# Patient Record
Sex: Female | Born: 1955 | Race: White | Hispanic: No | Marital: Married | Smoking: Never smoker
Health system: Southern US, Community
[De-identification: ages and names within clinical notes are randomized; demographics above are authoritative.]

## PROBLEM LIST (undated history)

## (undated) DIAGNOSIS — N200 Calculus of kidney: Secondary | ICD-10-CM

## (undated) HISTORY — PX: KIDNEY STONE SURGERY: SHX686

## (undated) HISTORY — PX: CATARACT EXTRACTION: SUR2

## (undated) HISTORY — PX: FOOT SURGERY: SHX648

## (undated) HISTORY — PX: LASIK: SHX215

## (undated) HISTORY — PX: PALATE / UVULA BIOPSY / EXCISION: SUR128

---

## 1998-04-08 ENCOUNTER — Other Ambulatory Visit: Admission: RE | Admit: 1998-04-08 | Discharge: 1998-04-08 | Payer: Self-pay | Admitting: Gynecology

## 1999-04-12 ENCOUNTER — Other Ambulatory Visit: Admission: RE | Admit: 1999-04-12 | Discharge: 1999-04-12 | Payer: Self-pay | Admitting: Gynecology

## 1999-10-14 ENCOUNTER — Other Ambulatory Visit: Admission: RE | Admit: 1999-10-14 | Discharge: 1999-10-14 | Payer: Self-pay | Admitting: Gynecology

## 1999-10-14 ENCOUNTER — Encounter (INDEPENDENT_AMBULATORY_CARE_PROVIDER_SITE_OTHER): Payer: Self-pay

## 2000-04-13 ENCOUNTER — Other Ambulatory Visit: Admission: RE | Admit: 2000-04-13 | Discharge: 2000-04-13 | Payer: Self-pay | Admitting: Gynecology

## 2001-02-15 ENCOUNTER — Encounter: Payer: Self-pay | Admitting: Obstetrics and Gynecology

## 2001-02-15 ENCOUNTER — Ambulatory Visit (HOSPITAL_COMMUNITY): Admission: RE | Admit: 2001-02-15 | Discharge: 2001-02-15 | Payer: Self-pay | Admitting: Obstetrics and Gynecology

## 2001-04-26 ENCOUNTER — Encounter (INDEPENDENT_AMBULATORY_CARE_PROVIDER_SITE_OTHER): Payer: Self-pay | Admitting: Specialist

## 2001-04-26 ENCOUNTER — Observation Stay (HOSPITAL_COMMUNITY): Admission: RE | Admit: 2001-04-26 | Discharge: 2001-04-27 | Payer: Self-pay | Admitting: Obstetrics and Gynecology

## 2005-06-01 ENCOUNTER — Ambulatory Visit: Payer: Self-pay | Admitting: Internal Medicine

## 2005-06-09 ENCOUNTER — Ambulatory Visit: Payer: Self-pay | Admitting: Internal Medicine

## 2006-05-26 ENCOUNTER — Encounter: Admission: RE | Admit: 2006-05-26 | Discharge: 2006-05-26 | Payer: Self-pay | Admitting: Family Medicine

## 2006-08-21 ENCOUNTER — Emergency Department (HOSPITAL_COMMUNITY): Admission: EM | Admit: 2006-08-21 | Discharge: 2006-08-21 | Payer: Self-pay | Admitting: Emergency Medicine

## 2008-07-08 ENCOUNTER — Ambulatory Visit: Payer: Self-pay | Admitting: Orthopedic Surgery

## 2008-07-08 DIAGNOSIS — M722 Plantar fascial fibromatosis: Secondary | ICD-10-CM | POA: Insufficient documentation

## 2008-07-09 ENCOUNTER — Encounter: Payer: Self-pay | Admitting: Orthopedic Surgery

## 2008-08-04 ENCOUNTER — Ambulatory Visit: Payer: Self-pay | Admitting: Orthopedic Surgery

## 2008-08-05 ENCOUNTER — Encounter (INDEPENDENT_AMBULATORY_CARE_PROVIDER_SITE_OTHER): Payer: Self-pay | Admitting: *Deleted

## 2008-08-21 ENCOUNTER — Encounter: Payer: Self-pay | Admitting: Orthopedic Surgery

## 2010-02-04 ENCOUNTER — Encounter: Admission: RE | Admit: 2010-02-04 | Discharge: 2010-02-04 | Payer: Self-pay | Admitting: Gastroenterology

## 2010-08-13 NOTE — Op Note (Signed)
[facility]  Patient:    Rebecca Fernandez, Rebecca Fernandez Visit Number: [id number] MRN: [id number]          Service Type: SUR Location: [id number] Attending Physician:  Oliver Pila Dictated by:   Alvino Chapel, M.D. Proc. Date: 04/26/01 Admit Date:  04/26/2001                             Operative Report  PREOPERATIVE DIAGNOSES:  1. Menorrhagia.  2. Abnormal uterine bleeding.  3. Familial history of blood clots.  POSTOPERATIVE DIAGNOSES:  1. Menorrhagia.  2. Abnormal uterine bleeding.  3. Familial history of blood clots.  PROCEDURE:  Total vaginal hysterectomy.  SURGEON:  Alvino Chapel, M.D.  ASSISTANT:  Malachi Pro. Ambrose Mantle, M.D.  ANESTHESIA:  General.  FINDINGS:  The uterus was normal size, adnexa were visually normal.  FLUIDS:  Estimated blood loss 250 cc, urine output 125 cc clear.  IV FLUIDS:  2800 cc LR.  SPECIMENS:  To pathology the uterus and cervix were sent in entirety.  DESCRIPTION OF PROCEDURE:  The patient was taken to the operating room where general anesthesia was obtained without difficulty. She was then prepped and draped in a normal sterile fashion in the dorsal lithotomy position. After the Foley catheter was placed, a weighted speculum was placed within the vagina and the cervix identified and grasped with Bartholome Bill on the anterior and posterior lip. The cervix was then injected circumferentially with a dilute solution of Pitressin with good blanching noted under the vaginal mucosa. The Bovie cautery unit was then utilized to make a circumferential incision around the cervix in a clockwise fashion and dissected the vaginal mucosa from the underlying cervix itself. The Mayo scissors were then further utilized to continue this dissection until the posterior cul-de-sac was quite close and this was entered with the Mayo scissors. With the posterior cul-de-sac identified, the Bonnano speculum was placed  within it thus isolating the uterus from the rectum. The slightly curved zeppelins clamp was then used to bilaterally clamp the uterosacral ligaments. Each uterosacral ligament was transected and secured with a #0 Vicryl suture ligature. Attention was then turned to the anterior of the uterus which was sharply and bluntly dissected under the vaginal mucosa. The anterior cul-de-sac was entered sharply and intraperitoneal placement of the Sims retractor then performed. Thus with the uterus isolated from the bladder and the rectum, the slightly curved zeppelins clamps were used to sequentially clamp each of the remaining broad ligaments. At each step, this was transected and suture ligated with #0 Vicryl. This was carried up to the level of the utero-ovarian ligament at which point the uterus was grasped with a towel clip at its fundus and was delivered from the vagina with the utero-ovarian ligaments then visible. Each was clamped with a slightly curved zepellin clamp and the uterus completely removed from each pedicle and handed to pathology. Each utero-ovarian ligament was then secured with both free tie and a suture ligature of #0 Vicryl with some area of bleeding noted at the left utero-ovarian ligament. This was then secured with several figure-of-eight sutures of #0 Vicryl with good hemostasis noted. The right utero-ovarian ligament was also carefully inspected and two additional figure-of-eight sutures of #0 Vicryl placed on that pedicle for good hemostasis. At this point, it was noted that the posterior vaginal cuff had some significant bleeding, therefore, the cuff itself was run in a running locked suture with  hemostasis obtained there also. A sponge stick was then placed within the vaginal cuff and no further active bleeding was noted, therefore, the peritoneum was purse stringed with #0 Vicryl and closed. The vagina cuff itself was then closed with both a running suture of #0  Vicryl and several interrupted sutures of the same with excellent hemostasis noted. A postoperative rectal was performed and was normal and the urine remained clear throughout the procedure. Sponge, lap and needle counts were correct and the patient was awakened from general anesthesia and taken to the recovery room in stable condition. Dictated by:   Alvino Chapel, M.D. Attending Physician:  Oliver Pila DD:  04/26/01 TD:  04/27/01 Job: [id number]

## 2010-08-13 NOTE — H&P (Signed)
[facility]  Patient:    Rebecca Fernandez, Rebecca Fernandez Visit Number: [id number] MRN: [id number]          Service Type: Attending:  Alvino Chapel, M.D. Dictated by:   Alvino Chapel, M.D. Adm. Date:  04/26/01                           History and Physical  BRIEF HISTORY: The patient is a 55 year old G2, P2 who presents for a scheduled vaginal hysterectomy given an ongoing problem with abnormal uterine bleeding.  The patient first noted the problem 2 years ago when her cycle shortened to approximately every 15 days and therefore she was bleeding at least half of each month.  The patient at that time did have an endometrial biopsy performed which was normal and had a trial of Provera given at which point she did not tolerate secondary to side effects.  Ultrasound revealed a normal sized uterus with some question of a intrauterine polyp however, on saline infusion this was not visible.  The patients past medical history is significant for kidney stones.  PAST SURGICAL HISTORY:  She did have a laparotomy with a stone retrieval.  ALLERGIES:  None.  MEDICATIONS: 1. Ambien 2. Iron supplements.  PAST GYN HISTORY:  No abnormal Pap smears.  Menstrual cycle is as previously stated q.15 days lasting 6 to 7 days.  PAST OBSTETRICAL HISTORY:  She has had normal spontaneous vaginal deliveries of 9 pound infants.  FAMILY HISTORY; There was some history of blood clots in her family which is another concern for hormone use.  There is no breast cancer, colon cancer or heart disease.  SOCIAL HISTORY: The patient is a nonsmoker.  She had a recent mammogram which was within normal limits and a cholesterol which was within normal limits.  A current FSH is premenopausal at 5.8.  PHYSICAL EXAMINATION:  GENERAL:  Height if 5 feet 2 inches.  Weight 140 pounds.  HEART:  Regular rate and rhythm.  LUNGS:  Clear to auscultation bilaterally.  ABDOMEN:  Soft and  nontender.  PELVIC:  The uterus has good descensus.  It is normal size.  Adnexa have no masses.  External genitalia is normal with no significant cystocele or rectocele noted.  The patient was counseled as to possible surgical approached for this problem given her intolerance of hormonal therapy and family history of blood clots. She was given the option for a trial of endometrial ablation with a ThermaChoice balloon versus having a vaginal hysterectomy.  The patients states that she wishes to proceed with definitive surgical therapy given the ongoing problem with bleeding and reactive anemia.  She was counseled as to the risks and benefits of the surgery including bleeding, infection, and possible damage to bowel and bladder.  She understands these risks and desires to proceed.  She was also counseled as to the possibility of requiring an incision to successfully perform the surgery should bleeding or any other eventuality occur which precludes removal of the uterus through the vagina and agrees to this as well. Dictated by:   Alvino Chapel, M.D. Attending:  Alvino Chapel, M.D. DD:  04/24/01 TD:  04/24/01 Job: [id number]

## 2010-09-12 ENCOUNTER — Emergency Department (HOSPITAL_COMMUNITY)
Admission: EM | Admit: 2010-09-12 | Discharge: 2010-09-12 | Disposition: A | Discharge status: Home / Self Care | Payer: 59 | Attending: Emergency Medicine | Admitting: Emergency Medicine

## 2010-09-12 ENCOUNTER — Emergency Department (HOSPITAL_COMMUNITY): Payer: 59

## 2010-09-12 DIAGNOSIS — N390 Urinary tract infection, site not specified: Secondary | ICD-10-CM | POA: Insufficient documentation

## 2010-09-12 DIAGNOSIS — Z79899 Other long term (current) drug therapy: Secondary | ICD-10-CM | POA: Insufficient documentation

## 2010-09-12 DIAGNOSIS — Z87442 Personal history of urinary calculi: Secondary | ICD-10-CM | POA: Insufficient documentation

## 2010-09-12 DIAGNOSIS — F411 Generalized anxiety disorder: Secondary | ICD-10-CM | POA: Insufficient documentation

## 2010-09-12 LAB — POCT I-STAT, CHEM 8
BUN: 4 mg/dL — ABNORMAL LOW (ref 6–23)
Creatinine, Ser: 0.9 mg/dL (ref 0.50–1.10)
Potassium: 3.7 mEq/L (ref 3.5–5.1)
Sodium: 136 mEq/L (ref 135–145)

## 2010-09-12 LAB — URINALYSIS, ROUTINE W REFLEX MICROSCOPIC
Glucose, UA: NEGATIVE mg/dL
Ketones, ur: NEGATIVE mg/dL
Protein, ur: NEGATIVE mg/dL
Urobilinogen, UA: 0.2 mg/dL (ref 0.0–1.0)

## 2010-09-14 LAB — URINE CULTURE
Colony Count: NO GROWTH
Culture  Setup Time: 201206181220
Culture: NO GROWTH

## 2010-11-12 ENCOUNTER — Ambulatory Visit (INDEPENDENT_AMBULATORY_CARE_PROVIDER_SITE_OTHER): Payer: 59 | Admitting: Urology

## 2010-11-12 DIAGNOSIS — R310 Gross hematuria: Secondary | ICD-10-CM

## 2011-01-07 ENCOUNTER — Ambulatory Visit: Payer: 59 | Admitting: Urology

## 2011-01-11 ENCOUNTER — Ambulatory Visit (INDEPENDENT_AMBULATORY_CARE_PROVIDER_SITE_OTHER): Payer: BC Managed Care – PPO | Admitting: Orthopedic Surgery

## 2011-01-11 ENCOUNTER — Encounter: Payer: Self-pay | Admitting: Orthopedic Surgery

## 2011-01-11 VITALS — BP 100/62 | Ht 61.75 in | Wt 135.0 lb

## 2011-01-11 DIAGNOSIS — M7710 Lateral epicondylitis, unspecified elbow: Secondary | ICD-10-CM

## 2011-01-11 NOTE — Patient Instructions (Addendum)
You have tennis elbow   You have received a steroid shot. 15% of patients experience increased pain at the injection site with in the next 24 hours. This is best treated with ice and tylenol extra strength 2 tabs every 8 hours. If you are still having pain please call the office.   Start exercises Thursday as instructed   Wear brace x 6 weeks ( except at night )  Take advil or aleve x 6 weeks (advil 400mg  3 x a day or aleve twice a day )

## 2011-01-11 NOTE — Progress Notes (Signed)
Chief complaint knot on LEFT elbow.  2 week history of pain with elbow extension or picking up objects with the LEFT upper extremity. It is in the LEFT elbow. The symptoms came on gradually and are associated with sharp, dull, throbbing, 7/10 pain, which comes and goes, especially in the morning. Reported catching and swelling.  Review of systems negative. Findings. Denies chest pain, shortness of breath, numbness, tingling. Review of systems positive findings constipation, hematuria, anxiety.  History reviewed. No pertinent past medical history.   Physical exam: Elbow  Vital signs recorded and are stable. General appearance normal. Orientation x3 normal. Mood and affect. Normal. Ambulation, normal.  Left elbow:  ROM is normal With passive range of motion and limited by 10 loss of extension with active Strength is normal  Stability is normal  Inspection: Tenderness over the lateral epicondyle Skin normal   Radial and ulnar pulse is normal  Epitrochlea nodes none Sensation is normal  Reflexes at the elbow are normal   Radiographs showed normal ulnohumeral joint with a small olecranon spur.  Impression tennis elbow.  Plan inject elbow Exercise program Tennis elbow brace  Followup if not improved  Lateral epicondyle injection. (LEFT elbow)  Consent.  Timeout to confirm site.  LEFT elbow was injected with Depo-Medrol 40 mg and lidocaine 1% 3 cc with sterile technique using alcohol and ethyl chloride prep.  There were no complications   Separate x-ray report.  Pain, lateral elbow.  AP and lateral elbow, show that there is no evidence of arthritis, but there is a small olecranon spur.  Impression normal elbow with olecranon spur.

## 2014-01-05 ENCOUNTER — Emergency Department (HOSPITAL_COMMUNITY)
Admission: EM | Admit: 2014-01-05 | Discharge: 2014-01-05 | Disposition: A | Discharge status: Home / Self Care | Payer: 59 | Attending: Emergency Medicine | Admitting: Emergency Medicine

## 2014-01-05 ENCOUNTER — Encounter (HOSPITAL_COMMUNITY): Payer: Self-pay | Admitting: Emergency Medicine

## 2014-01-05 ENCOUNTER — Emergency Department (HOSPITAL_COMMUNITY): Payer: 59

## 2014-01-05 DIAGNOSIS — M79642 Pain in left hand: Secondary | ICD-10-CM

## 2014-01-05 DIAGNOSIS — S6992XA Unspecified injury of left wrist, hand and finger(s), initial encounter: Secondary | ICD-10-CM | POA: Diagnosis present

## 2014-01-05 DIAGNOSIS — Y92010 Kitchen of single-family (private) house as the place of occurrence of the external cause: Secondary | ICD-10-CM | POA: Insufficient documentation

## 2014-01-05 DIAGNOSIS — Z79899 Other long term (current) drug therapy: Secondary | ICD-10-CM | POA: Insufficient documentation

## 2014-01-05 DIAGNOSIS — X58XXXA Exposure to other specified factors, initial encounter: Secondary | ICD-10-CM | POA: Diagnosis not present

## 2014-01-05 DIAGNOSIS — Y9389 Activity, other specified: Secondary | ICD-10-CM | POA: Diagnosis not present

## 2014-01-05 MED ORDER — MELOXICAM 15 MG PO TABS: 15.0000 mg | ORAL_TABLET | Freq: Every day | ORAL | Status: DC

## 2014-01-05 MED ORDER — ONDANSETRON HCL 4 MG PO TABS
4.0000 mg | ORAL_TABLET | Freq: Once | ORAL | Status: AC
  Administered 2014-01-05: 4 mg via ORAL
  Filled 2014-01-05: qty 1

## 2014-01-05 MED ORDER — KETOROLAC TROMETHAMINE 10 MG PO TABS
10.0000 mg | ORAL_TABLET | Freq: Once | ORAL | Status: AC
Start: 2014-01-05 — End: 2014-01-05
  Administered 2014-01-05: 10 mg via ORAL
  Filled 2014-01-05: qty 1

## 2014-01-05 MED ORDER — HYDROCODONE-ACETAMINOPHEN 5-325 MG PO TABS
2.0000 | ORAL_TABLET | Freq: Once | ORAL | Status: AC
  Administered 2014-01-05: 2 via ORAL
  Filled 2014-01-05: qty 2

## 2014-01-05 MED ORDER — HYDROCODONE-ACETAMINOPHEN 5-325 MG PO TABS: 1.0000 | ORAL_TABLET | ORAL | Status: DC | PRN

## 2014-01-05 NOTE — ED Provider Notes (Signed)
CSN: [id number]     Arrival date & time 01/05/14  1656 History  This chart was scribed for Lily Kocher, PA-C, working with Veryl Speak, MD by Steva Colder, ED Scribe. The patient was seen in room APFT23/APFT23 at 7:06 PM.     Chief Complaint  Patient presents with  . Hand Injury    HPI Rebecca Fernandez is a 58 y.o. female who presents today complaining of left hand injury onset yesterday. She states that she was pushing an [place] in the kitchen. She states that it began to hurt. She states that the pain has been getting worse. She states that she can not bend the hand or lay it flat. She states that she didn't do more than what she normally does as far as involving her hands. She states that she picked up laminated floors. 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 She states that she tried Ice with no relief for her symptoms. She denies any other associated symptoms. She denies being on a blood thinner medication.  History reviewed. No pertinent past medical history. Past Surgical History  Procedure Laterality Date  . Foot surgery    . Kidney stone surgery    . Lasik     Family History  Problem Relation Age of Onset  . Heart disease    . Arthritis    . Lung disease    . Cancer     History  Substance Use Topics  . Smoking status: Never Smoker   . Smokeless tobacco: Not on file  . Alcohol Use: No     Comment: occasionally   OB History   Grav Para Term Preterm Abortions TAB SAB Ect Mult Living                 Review of Systems  Musculoskeletal: Positive for arthralgias (left hand).      Allergies  Review of patient's allergies indicates no known allergies.  Home Medications   Prior to Admission medications   Medication Sig Start Date End Date Taking? Authorizing Provider  buPROPion (ZYBAN) 150 MG 12 hr tablet Take 150 mg by mouth 2 (two) times daily.     Historical Provider, MD  clonazePAM (KLONOPIN) 0.5 MG tablet Take 0.5 mg by mouth 2  (two) times daily as needed.     Historical Provider, MD  estradiol (ESTRACE) 0.5 MG tablet Take 0.5 mg by mouth daily.     Historical Provider, MD  zolpidem (AMBIEN) 10 MG tablet Take 10 mg by mouth at bedtime as needed.     Historical Provider, MD   BP 117/69  Pulse 67  Temp(Src) 97.9 F (36.6 C) (Oral)  Resp 16  Ht 5\' 2"  (1.575 m)  Wt 135 lb (61.236 kg)  BMI 24.69 kg/m2  SpO2 100%  Physical Exam  Nursing note and vitals reviewed. Constitutional: She is oriented to person, place, and time. She appears well-developed and well-nourished. No distress.  HENT:  Head: Normocephalic and atraumatic.  Eyes: EOM are normal.  Neck: Neck supple. No tracheal deviation present.  Cardiovascular: Normal rate.   Pulses:      Radial pulses are 2+ on the left side.  Brachial pulses 2+  Pulmonary/Chest: Effort normal. No respiratory distress.  Musculoskeletal: Normal range of motion.  Full ROM of the left shoulder and elbow. Capillary refill less than 2 seconds. Mild to moderate swelling of the fingers to the left hand and left hand. Bruising to the  ulnar surface of the left hand just below MP joint of the fith finger, pain of the lesser thenar eminence extending into the wrist to palpation and movement.   Neurological: She is alert and oriented to person, place, and time.  Skin: Skin is warm and dry.  Psychiatric: She has a normal mood and affect. Her behavior is normal.    ED Course  Procedures (including critical care time) DIAGNOSTIC STUDIES: Oxygen Saturation is 100% on room air, normal by my interpretation.    COORDINATION OF CARE: 7:15 PM-Discussed treatment plan which includes Splint, referral to Orthopedist, pain medication with pt at bedside and pt agreed to plan.   Labs Review Labs Reviewed - No data to display  Imaging Review Dg Hand Complete Left  01/05/2014   CLINICAL DATA:  Hand injury while moving furniture. Left palmar hand pain. Initial encounter.  EXAM: LEFT HAND -  COMPLETE 3+ VIEW  COMPARISON:  None.  FINDINGS: There is no evidence of fracture or dislocation. There is no evidence of arthropathy or other focal bone abnormality. Soft tissues are unremarkable.  IMPRESSION: Negative.   Electronically Signed   By: Earle Gell M.D.   On: 01/05/2014 18:23     EKG Interpretation None      MDM Vital signs are within normal limits. Pulse oximetry is 100% on room air. Within normal limits by my interpretation.  Xray of the left hand is negative.The radial and brachial pulses are 2+. Capillary refill is less than 2 seconds. Patient will not allow testing of the pulmar  arch due to pain. The thenar imminence is pink. There is bruising to the lateral surface of the fifth metacarpal and there is pain at the radial surface of the wrists extending into the lower portion of the thenar eminence. Suspect that the patient has an overuse injury, and possibly A. occult bruise on given the bruised area of the side of her hand. The tips of her fingers are cool, slightly cooler on the left than on the right, but as noted above capillary refill is less than 2 seconds. There is no pain or tingling involving the fingers, the pain is at the lateral hand with bruising is. There is no tendon compromise.  The plan at this time is for the patient be fitted with a wrist splint, Norco, and Mobic. Patient is to return to the emergency department if any changes, problems, or concerns.    Final diagnoses:  None    **I have reviewed nursing notes, vital signs, and all appropriate lab and imaging results for this patient.*  I personally performed the services described in this documentation, which was scribed in my presence. The recorded information has been reviewed and is accurate.    Lenox Ahr, PA-C 01/05/14 1928

## 2014-01-05 NOTE — ED Notes (Addendum)
Pt reports left hand pain since helping husband move furniture yesterday. No obvious deformity noted. Distal pulses noted. cap wnl.

## 2014-01-05 NOTE — ED Notes (Signed)
Wrist splint placed to left wrist, instructed on placement and length of time for patient to wear device given. Discharge instructions given, pt demonstrated teach back and verbal understanding. No concerns voiced.

## 2014-01-05 NOTE — ED Notes (Signed)
EDP notified of findings on primary assessment. Left hand cool and pale with positive cap refil and pulses.

## 2014-01-05 NOTE — Discharge Instructions (Signed)
Please use the wrist splint for the next 7-10 days. Please use Mobic daily with food, use Norco for pain if needed. This medication may cause drowsiness, please use with caution. Please see Dr. Luna Glasgow for evaluation if not improving with current splint medications. Please return to the emergency apartment immediately if any unusual color changes of the hand, high fevers, inability to use the hand, unusual swelling, or deterioration in your general condition.

## 2014-01-05 NOTE — ED Provider Notes (Signed)
Medical screening examination/treatment/procedure(s) were performed by non-physician practitioner and as supervising physician I was immediately available for consultation/collaboration.     Veryl Speak, MD 01/05/14 2351

## 2014-11-04 ENCOUNTER — Other Ambulatory Visit: Payer: Self-pay

## 2015-01-23 ENCOUNTER — Encounter (HOSPITAL_BASED_OUTPATIENT_CLINIC_OR_DEPARTMENT_OTHER): Payer: Self-pay

## 2015-03-20 ENCOUNTER — Ambulatory Visit (HOSPITAL_BASED_OUTPATIENT_CLINIC_OR_DEPARTMENT_OTHER): Payer: Self-pay

## 2015-04-17 ENCOUNTER — Encounter (HOSPITAL_BASED_OUTPATIENT_CLINIC_OR_DEPARTMENT_OTHER): Payer: Self-pay

## 2015-06-12 ENCOUNTER — Encounter: Payer: Self-pay | Admitting: Internal Medicine

## 2015-11-10 DIAGNOSIS — G4700 Insomnia, unspecified: Secondary | ICD-10-CM | POA: Insufficient documentation

## 2015-11-10 DIAGNOSIS — E785 Hyperlipidemia, unspecified: Secondary | ICD-10-CM | POA: Insufficient documentation

## 2015-11-10 DIAGNOSIS — G4733 Obstructive sleep apnea (adult) (pediatric): Secondary | ICD-10-CM | POA: Insufficient documentation

## 2015-11-10 DIAGNOSIS — F419 Anxiety disorder, unspecified: Secondary | ICD-10-CM | POA: Insufficient documentation

## 2016-03-16 ENCOUNTER — Other Ambulatory Visit (HOSPITAL_BASED_OUTPATIENT_CLINIC_OR_DEPARTMENT_OTHER): Payer: Self-pay

## 2016-03-16 DIAGNOSIS — R0683 Snoring: Secondary | ICD-10-CM

## 2016-03-16 DIAGNOSIS — G4733 Obstructive sleep apnea (adult) (pediatric): Secondary | ICD-10-CM

## 2016-03-18 ENCOUNTER — Ambulatory Visit: Payer: Commercial Managed Care - PPO | Attending: Otolaryngology | Admitting: Neurology

## 2016-03-18 DIAGNOSIS — R0683 Snoring: Secondary | ICD-10-CM | POA: Insufficient documentation

## 2016-03-18 DIAGNOSIS — Z79899 Other long term (current) drug therapy: Secondary | ICD-10-CM | POA: Diagnosis not present

## 2016-03-18 DIAGNOSIS — G4733 Obstructive sleep apnea (adult) (pediatric): Secondary | ICD-10-CM

## 2016-04-02 NOTE — Procedures (Signed)
   Waverly A. Merlene Laughter, MD     [email or web address]             NOCTURNAL POLYSOMNOGRAPHY   LOCATION: [facility]  Patient Name: Rebecca, Fernandez Date: 03/18/2016 Gender: Female D.O.B: 1956-02-04 Age (years): 60 Referring Provider: Jodi Marble Height (inches): 61 Interpreting Physician: Phillips Odor MD, ABSM Weight (lbs): 138 RPSGT: Peak, Robert BMI: 26 MRN: [id number] Neck Size: 13.25 CLINICAL INFORMATION Sleep Study Type: NPSG  Indication for sleep study: OSA, Snoring  Epworth Sleepiness Score: 7  SLEEP STUDY TECHNIQUE As per the AASM Manual for the Scoring of Sleep and Associated Events v2.3 (April 2016) with a hypopnea requiring 4% desaturations.  The channels recorded and monitored were frontal, central and occipital EEG, electrooculogram (EOG), submentalis EMG (chin), nasal and oral airflow, thoracic and abdominal wall motion, anterior tibialis EMG, snore microphone, electrocardiogram, and pulse oximetry.  MEDICATIONS Medications self-administered by patient taken the night of the study : N/A  Current Outpatient Prescriptions:  .  buPROPion (ZYBAN) 150 MG 12 hr tablet, Take 150 mg by mouth 2 (two) times daily. , Disp: , Rfl:  .  clonazePAM (KLONOPIN) 0.5 MG tablet, Take 0.5 mg by mouth 2 (two) times daily as needed. , Disp: , Rfl:  .  estradiol (ESTRACE) 0.5 MG tablet, Take 0.5 mg by mouth daily. , Disp: , Rfl:  .  HYDROcodone-acetaminophen (NORCO/VICODIN) 5-325 MG per tablet, Take 1 tablet by mouth every 4 (four) hours as needed., Disp: 15 tablet, Rfl: 0 .  meloxicam (MOBIC) 15 MG tablet, Take 1 tablet (15 mg total) by mouth daily., Disp: 7 tablet, Rfl: 0 .  zolpidem (AMBIEN) 10 MG tablet, Take 10 mg by mouth at bedtime as needed. , Disp: , Rfl:    SLEEP ARCHITECTURE The study was initiated at 9:49:23 PM and ended at 5:05:50 AM.  Sleep onset time was 1.5 minutes and the sleep efficiency was 94.6%. The total sleep time was 413.0  minutes.  Stage REM latency was 147.5 minutes.  The patient spent 7.39% of the night in stage N1 sleep, 67.55% in stage N2 sleep, 0.12% in stage N3 and 24.94% in REM.  Alpha intrusion was absent.  Supine sleep was 64.93%.  RESPIRATORY PARAMETERS The overall apnea/hypopnea index (AHI) was 2.3 per hour. There were 13 total apneas, including 12 obstructive, 1 central and 0 mixed apneas. There were 3 hypopneas and 19 RERAs.  The AHI during Stage REM sleep was 0.6 per hour.  AHI while supine was 3.6 per hour.  The mean oxygen saturation was 96.65%. The minimum SpO2 during sleep was 90.00%.  snoring was noted during this study.  CARDIAC DATA The 2 lead EKG demonstrated sinus rhythm. The mean heart rate was 66.32 beats per minute. Other EKG findings include: None. LEG MOVEMENT DATA The total PLMS were 0 with a resulting PLMS index of 0.00. Associated arousal with leg movement index was 0.0.  IMPRESSIONS - No significant obstructive sleep apnea occurred during this study. - No significant central sleep apnea occurred during this study. - Reduce slow wave sleep is observed.    Delano Metz, MD Diplomate, American Board of Sleep Medicine.'

## 2016-05-03 DIAGNOSIS — J343 Hypertrophy of nasal turbinates: Secondary | ICD-10-CM | POA: Insufficient documentation

## 2016-05-03 DIAGNOSIS — J342 Deviated nasal septum: Secondary | ICD-10-CM | POA: Insufficient documentation

## 2017-02-15 ENCOUNTER — Other Ambulatory Visit (HOSPITAL_COMMUNITY): Payer: Self-pay | Admitting: Otolaryngology

## 2017-02-15 DIAGNOSIS — R1310 Dysphagia, unspecified: Secondary | ICD-10-CM

## 2017-02-23 ENCOUNTER — Ambulatory Visit (HOSPITAL_COMMUNITY)
Admission: RE | Admit: 2017-02-23 | Discharge: 2017-02-23 | Disposition: A | Discharge status: Home / Self Care | Payer: Commercial Managed Care - PPO | Source: Ambulatory Visit | Attending: Otolaryngology | Admitting: Otolaryngology

## 2017-02-23 DIAGNOSIS — R1310 Dysphagia, unspecified: Secondary | ICD-10-CM | POA: Diagnosis present

## 2017-02-23 DIAGNOSIS — K1379 Other lesions of oral mucosa: Secondary | ICD-10-CM | POA: Diagnosis not present

## 2017-02-23 NOTE — Progress Notes (Signed)
Modified Barium Swallow Progress Note  Patient Details  Name: Rebecca Fernandez MRN: [id number] Date of Birth: 01-14-1956  Today's Date: 02/23/2017  Modified Barium Swallow completed.  Full report located under Chart Review in the Imaging Section.  Brief recommendations include the following:  Clinical Impression  Pt's oropharyngeal swallow is within gross functional limits. She has trace coatings of residue in her valleculae with thin liquids and very small bites of puree, which clears with a spontaneous second swallow. Velopharyngeal closure appears adequate. Pt has subjective reports of all consistencies tested being stuck in her throat and not clearing, even when watching herself swallow ont he screen to see that the barium had all cleared. Question if this could be globus sensation related to potential esophageal issues, although there was no contrast that appeared to remain in her esophagus upon completion of the study, and the barium tablet cleared swiftly. SLP provided education about esophageal precautions - SLP f/u does not appear to be indicated as her oropharyngeal function appears intact.   Swallow Evaluation Recommendations   Recommended Consults: Consider GI evaluation;Consider esophageal assessment   SLP Diet Recommendations: Regular solids;Thin liquid   Liquid Administration via: Cup;Straw   Medication Administration: Whole meds with liquid   Supervision: Patient able to self feed   Compensations: Slow rate;Small sips/bites;Follow solids with liquid   Postural Changes: Remain semi-upright after after feeds/meals (Comment);Seated upright at 90 degrees   Oral Care Recommendations: Oral care BID        Germain Osgood 02/23/2017,12:54 PM   Germain Osgood, M.A. CCC-SLP [phone]

## 2017-09-01 ENCOUNTER — Other Ambulatory Visit: Payer: Self-pay | Admitting: Otolaryngology

## 2017-09-01 DIAGNOSIS — R1314 Dysphagia, pharyngoesophageal phase: Secondary | ICD-10-CM

## 2017-09-14 ENCOUNTER — Ambulatory Visit
Admission: RE | Admit: 2017-09-14 | Discharge: 2017-09-14 | Disposition: A | Discharge status: Home / Self Care | Payer: Self-pay | Source: Ambulatory Visit | Attending: Otolaryngology | Admitting: Otolaryngology

## 2017-09-14 ENCOUNTER — Other Ambulatory Visit: Payer: Commercial Managed Care - PPO

## 2017-09-14 DIAGNOSIS — R1314 Dysphagia, pharyngoesophageal phase: Secondary | ICD-10-CM

## 2017-09-22 ENCOUNTER — Other Ambulatory Visit: Payer: Self-pay | Admitting: Otolaryngology

## 2017-09-22 DIAGNOSIS — R1319 Other dysphagia: Secondary | ICD-10-CM

## 2017-09-22 DIAGNOSIS — R1310 Dysphagia, unspecified: Secondary | ICD-10-CM

## 2017-09-29 ENCOUNTER — Ambulatory Visit
Admission: RE | Admit: 2017-09-29 | Discharge: 2017-09-29 | Disposition: A | Discharge status: Home / Self Care | Payer: Managed Care, Other (non HMO) | Source: Ambulatory Visit | Attending: Otolaryngology | Admitting: Otolaryngology

## 2017-09-29 DIAGNOSIS — R1319 Other dysphagia: Secondary | ICD-10-CM

## 2017-09-29 DIAGNOSIS — R1310 Dysphagia, unspecified: Secondary | ICD-10-CM

## 2017-09-29 MED ORDER — IOPAMIDOL (ISOVUE-300) INJECTION 61%
75.0000 mL | Freq: Once | INTRAVENOUS | Status: AC | PRN
  Administered 2017-09-29: 75 mL via INTRAVENOUS

## 2017-10-05 ENCOUNTER — Other Ambulatory Visit (HOSPITAL_COMMUNITY): Payer: Self-pay | Admitting: Otolaryngology

## 2017-10-05 DIAGNOSIS — R911 Solitary pulmonary nodule: Secondary | ICD-10-CM

## 2017-10-09 ENCOUNTER — Other Ambulatory Visit (HOSPITAL_COMMUNITY): Payer: Self-pay | Admitting: Otolaryngology

## 2017-10-09 DIAGNOSIS — R911 Solitary pulmonary nodule: Secondary | ICD-10-CM

## 2017-10-13 ENCOUNTER — Ambulatory Visit (HOSPITAL_COMMUNITY)
Admission: RE | Admit: 2017-10-13 | Discharge: 2017-10-13 | Disposition: A | Discharge status: Home / Self Care | Payer: Managed Care, Other (non HMO) | Source: Ambulatory Visit | Attending: Otolaryngology | Admitting: Otolaryngology

## 2017-10-13 DIAGNOSIS — I700 Atherosclerosis of aorta: Secondary | ICD-10-CM | POA: Insufficient documentation

## 2017-10-13 DIAGNOSIS — N289 Disorder of kidney and ureter, unspecified: Secondary | ICD-10-CM | POA: Diagnosis not present

## 2017-10-13 DIAGNOSIS — R911 Solitary pulmonary nodule: Secondary | ICD-10-CM | POA: Diagnosis not present

## 2017-10-13 DIAGNOSIS — I2510 Atherosclerotic heart disease of native coronary artery without angina pectoris: Secondary | ICD-10-CM | POA: Diagnosis not present

## 2017-10-13 DIAGNOSIS — N200 Calculus of kidney: Secondary | ICD-10-CM | POA: Diagnosis not present

## 2017-10-13 LAB — GLUCOSE, CAPILLARY: Glucose-Capillary: 86 mg/dL (ref 70–99)

## 2017-10-13 MED ORDER — FLUDEOXYGLUCOSE F - 18 (FDG) INJECTION
5.5000 | Freq: Once | INTRAVENOUS | Status: AC | PRN
  Administered 2017-10-13: 5.5 via INTRAVENOUS

## 2017-10-19 ENCOUNTER — Ambulatory Visit (HOSPITAL_COMMUNITY): Payer: Managed Care, Other (non HMO)

## 2017-10-19 ENCOUNTER — Telehealth: Payer: Self-pay

## 2017-10-19 ENCOUNTER — Other Ambulatory Visit: Payer: Self-pay

## 2017-10-19 NOTE — Telephone Encounter (Signed)
SENT REFERRAL TO SCHEDULING AND FILED NOTES 

## 2017-12-05 ENCOUNTER — Encounter: Payer: Self-pay | Admitting: Cardiovascular Disease

## 2017-12-05 ENCOUNTER — Ambulatory Visit: Payer: Managed Care, Other (non HMO) | Admitting: Cardiovascular Disease

## 2017-12-05 DIAGNOSIS — I2510 Atherosclerotic heart disease of native coronary artery without angina pectoris: Secondary | ICD-10-CM

## 2017-12-05 DIAGNOSIS — R0609 Other forms of dyspnea: Secondary | ICD-10-CM | POA: Diagnosis not present

## 2017-12-05 DIAGNOSIS — R002 Palpitations: Secondary | ICD-10-CM | POA: Insufficient documentation

## 2017-12-05 NOTE — Assessment & Plan Note (Signed)
Rebecca Fernandez was referred to me because of coronary artery calcification seen on chest CT performed 09/29/2017.  She really has no cardiac risk factors but does complain of new onset dyspnea over the last 6 months as well as palpitations.  I am going to get an exercise Myoview stress test and 2D echo to further evaluate.

## 2017-12-05 NOTE — Progress Notes (Signed)
12/05/2017 Rebecca Fernandez   Apr 29, 1955  [id number]  Primary Physician Aletha Halim., PA-C Primary Cardiologist: Lorretta Harp MD Lupe Car[place], [place]  HPI:  Rebecca Fernandez is a 62 y.o. thin appearing married Caucasian female mother of one daughter, grandmother of one grand child referred by Bing Matter PA-C for cardiovascular evaluation because of coronary calcification seen on chest CT performed 09/29/2017.  She has no cardiac risk factors.  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  She is never had a heart attack or stroke.  She works out at [employer] at her place of employment frequently.  She apparently had a ENT work-up and had a chest CT that showed coronary calcification which was seen on PET scan as well.  She does complain of dyspnea on exertion over the last 6 months as well as some palpitations occurring on a weekly basis and right arm tingling.   Current Meds  Medication Sig  . aspirin EC 81 MG tablet Take 81 mg by mouth daily.  Marland Kitchen buPROPion (ZYBAN) 150 MG 12 hr tablet Take 150 mg by mouth 2 (two) times daily.   . clonazePAM (KLONOPIN) 0.5 MG tablet Take 0.5 mg by mouth 2 (two) times daily as needed.   Marland Kitchen estradiol (ESTRACE) 0.5 MG tablet Take 0.5 mg by mouth daily.   . Loratadine 10 MG CAPS Take 1 capsule by mouth daily.  Marland Kitchen zolpidem (AMBIEN) 10 MG tablet Take 10 mg by mouth at bedtime as needed.      No Known Allergies  Social History   Socioeconomic History  . Marital status: Married    Spouse name: Not on file  . Number of children: Not on file  . Years of education: Not on file  . Highest education level: Not on file  Occupational History  . Not on file  Social Needs  . Financial resource strain: Not on file  . Food insecurity:    Worry: Not on file    Inability: Not on file  . Transportation needs:    Medical: Not on file    Non-medical: Not on file  Tobacco Use  . Smoking status: Never Smoker  . Smokeless tobacco: Never Used    Substance and Sexual Activity  . Alcohol use: No    Comment: occasionally  . Drug use: No  . Sexual activity: Not on file  Lifestyle  . Physical activity:    Days per week: Not on file    Minutes per session: Not on file  . Stress: Not on file  Relationships  . Social connections:    Talks on phone: Not on file    Gets together: Not on file    Attends religious service: Not on file    Active member of club or organization: Not on file    Attends meetings of clubs or organizations: Not on file    Relationship status: Not on file  . Intimate partner violence:    Fear of current or ex partner: Not on file    Emotionally abused: Not on file    Physically abused: Not on file    Forced sexual activity: Not on file  Other Topics Concern  . Not on file  Social History Narrative  . Not on file     Review of Systems: General: negative for chills, fever, night sweats or weight changes.  Cardiovascular: negative for chest pain, dyspnea on exertion, edema, orthopnea, palpitations, paroxysmal nocturnal dyspnea  or shortness of breath Dermatological: negative for rash Respiratory: negative for cough or wheezing Urologic: negative for hematuria Abdominal: negative for nausea, vomiting, diarrhea, bright red blood per rectum, melena, or hematemesis Neurologic: negative for visual changes, syncope, or dizziness All other systems reviewed and are otherwise negative except as noted above.    Blood pressure 112/72, pulse 80, height 5' 1.75" (1.568 m), weight 113 lb 9.6 oz (51.5 kg).  General appearance: alert and no distress Neck: no adenopathy, no carotid bruit, no JVD, supple, symmetrical, trachea midline and thyroid not enlarged, symmetric, no tenderness/mass/nodules Lungs: clear to auscultation bilaterally Heart: regular rate and rhythm, S1, S2 normal, no murmur, click, rub or gallop Extremities: extremities normal, atraumatic, no cyanosis or edema Pulses: 2+ and symmetric Skin: Skin  color, texture, turgor normal. No rashes or lesions Neurologic: Alert and oriented X 3, normal strength and tone. Normal symmetric reflexes. Normal coordination and gait  EKG sinus rhythm at 80 with low limb voltage and incomplete right bundle branch block.  I personally reviewed this EKG.  ASSESSMENT AND PLAN:   Dyspnea on exertion Ms. Proud was referred to me because of coronary artery calcification seen on chest CT performed 09/29/2017.  She really has no cardiac risk factors but does complain of new onset dyspnea over the last 6 months as well as palpitations.  I am going to get an exercise Myoview stress test and 2D echo to further evaluate.  Coronary artery calcification seen on CT scan Seen on CT scan performed 09/29/2017.  Palpitations New onset palpitations over the last 6 months occurring weekly lasting for minutes at a time.  She does admit to drinking 4 to 5 cups of coffee a day although It has not changed for the last 20 years.  I am going to obtain a 2-week event monitor to further evaluate.      Lorretta Harp MD FACP,FACC,FAHA, [facility] 12/05/2017 8:39 AM

## 2017-12-05 NOTE — Assessment & Plan Note (Signed)
New onset palpitations over the last 6 months occurring weekly lasting for minutes at a time.  She does admit to drinking 4 to 5 cups of coffee a day although It has not changed for the last 20 years.  I am going to obtain a 2-week event monitor to further evaluate.

## 2017-12-05 NOTE — Assessment & Plan Note (Signed)
Seen on CT scan performed 09/29/2017.

## 2017-12-05 NOTE — Patient Instructions (Signed)
Medication Instructions:  Your physician recommends that you continue on your current medications as directed. Please refer to the Current Medication list given to you today.   Labwork: none  Testing/Procedures: Your physician has requested that you have an echocardiogram. Echocardiography is a painless test that uses sound waves to create images of your heart. It provides your doctor with information about the size and shape of your heart and how well your heart's chambers and valves are working. This procedure takes approximately one hour. There are no restrictions for this procedure.  Your physician has requested that you have en exercise stress myoview. For further information please visit [email or web address]. Please follow instruction sheet, as given.  Your physician has recommended that you wear an 2 week event monitor. Event monitors are medical devices that record the heart's electrical activity. Doctors most often [place] these monitors to diagnose arrhythmias. Arrhythmias are problems with the speed or rhythm of the heartbeat. The monitor is a small, portable device. You can wear one while you do your normal daily activities. This is usually used to diagnose what is causing palpitations/syncope (passing out).    Follow-Up: Your physician recommends that you schedule a follow-up appointment in: 6-8 weeks (once all testing is completed).   Any Other Special Instructions Will Be Listed Below (If Applicable).     If you need a refill on your cardiac medications before your next appointment, please call your pharmacy.

## 2017-12-07 ENCOUNTER — Telehealth (HOSPITAL_COMMUNITY): Payer: Self-pay | Admitting: *Deleted

## 2017-12-07 NOTE — Telephone Encounter (Signed)
Patient given detailed instructions per Myocardial Perfusion Study Information Sheet for the test on 12/12/17. Patient notified to arrive 15 minutes early and that it is imperative to arrive on time for appointment to keep from having the test rescheduled.  If you need to cancel or reschedule your appointment, please call the office within 24 hours of your appointment. . Patient verbalized understanding. Kirstie Peri

## 2017-12-11 ENCOUNTER — Telehealth: Payer: Self-pay | Admitting: Cardiovascular Disease

## 2017-12-11 NOTE — Telephone Encounter (Signed)
Returned call to pt all questions answered 

## 2017-12-11 NOTE — Telephone Encounter (Signed)
New Message:     Pt wants to know  if it will be alright for her to take the flu shot this year?

## 2017-12-12 ENCOUNTER — Ambulatory Visit (HOSPITAL_BASED_OUTPATIENT_CLINIC_OR_DEPARTMENT_OTHER): Payer: Managed Care, Other (non HMO)

## 2017-12-12 ENCOUNTER — Other Ambulatory Visit: Payer: Self-pay

## 2017-12-12 ENCOUNTER — Ambulatory Visit (INDEPENDENT_AMBULATORY_CARE_PROVIDER_SITE_OTHER): Payer: Managed Care, Other (non HMO)

## 2017-12-12 ENCOUNTER — Ambulatory Visit (HOSPITAL_COMMUNITY): Payer: Managed Care, Other (non HMO) | Attending: Cardiology

## 2017-12-12 VITALS — Ht 61.0 in | Wt 113.0 lb

## 2017-12-12 DIAGNOSIS — I2510 Atherosclerotic heart disease of native coronary artery without angina pectoris: Secondary | ICD-10-CM

## 2017-12-12 DIAGNOSIS — R002 Palpitations: Secondary | ICD-10-CM

## 2017-12-12 DIAGNOSIS — R0609 Other forms of dyspnea: Secondary | ICD-10-CM

## 2017-12-12 DIAGNOSIS — R9430 Abnormal result of cardiovascular function study, unspecified: Secondary | ICD-10-CM | POA: Insufficient documentation

## 2017-12-12 LAB — MYOCARDIAL PERFUSION IMAGING
CHL CUP NUCLEAR SDS: 3
CHL CUP NUCLEAR SRS: 2
CSEPPHR: 137 {beats}/min
Estimated workload: 9.3 METS
Exercise duration (min): 7 min
Exercise duration (sec): 31 s
LVDIAVOL: 52 mL (ref 46–106)
LVSYSVOL: 17 mL
MPHR: 157 {beats}/min
Percent HR: 87 %
RPE: 19
Rest HR: 64 {beats}/min
SSS: 5
TID: 1.06

## 2017-12-12 LAB — ECHOCARDIOGRAM COMPLETE
HEIGHTINCHES: 61 in
Weight: 1808 oz

## 2017-12-12 MED ORDER — TECHNETIUM TC 99M TETROFOSMIN IV KIT
31.2000 | PACK | Freq: Once | INTRAVENOUS | Status: AC | PRN
  Administered 2017-12-12: 31.2 via INTRAVENOUS
  Filled 2017-12-12: qty 32

## 2017-12-12 MED ORDER — TECHNETIUM TC 99M PYROPHOSPHATE: 8.8000 | Freq: Once | INTRAVENOUS | Status: AC

## 2017-12-12 MED ORDER — PERFLUTREN LIPID MICROSPHERE
1.0000 mL | INTRAVENOUS | Status: AC | PRN
  Administered 2017-12-12: 1 mL via INTRAVENOUS

## 2017-12-15 ENCOUNTER — Telehealth: Payer: Self-pay | Admitting: Cardiovascular Disease

## 2017-12-15 NOTE — Telephone Encounter (Signed)
Spoke to patient in regards to results-advised to continue wearing monitor as instructed and keep follow up as scheduled.  Patient verbalized understanding.

## 2017-12-15 NOTE — Telephone Encounter (Signed)
Per pt call would like a call back about the results of the test she did on 12/12/17. Please.

## 2018-01-05 ENCOUNTER — Other Ambulatory Visit: Payer: Self-pay

## 2018-01-05 ENCOUNTER — Emergency Department (HOSPITAL_COMMUNITY): Payer: Managed Care, Other (non HMO)

## 2018-01-05 ENCOUNTER — Encounter (HOSPITAL_COMMUNITY): Payer: Self-pay | Admitting: Emergency Medicine

## 2018-01-05 ENCOUNTER — Emergency Department (HOSPITAL_COMMUNITY)
Admission: EM | Admit: 2018-01-05 | Discharge: 2018-01-05 | Disposition: A | Discharge status: Home / Self Care | Payer: Managed Care, Other (non HMO) | Attending: Emergency Medicine | Admitting: Emergency Medicine

## 2018-01-05 DIAGNOSIS — N2889 Other specified disorders of kidney and ureter: Secondary | ICD-10-CM

## 2018-01-05 DIAGNOSIS — R109 Unspecified abdominal pain: Secondary | ICD-10-CM | POA: Diagnosis present

## 2018-01-05 DIAGNOSIS — Z79899 Other long term (current) drug therapy: Secondary | ICD-10-CM | POA: Insufficient documentation

## 2018-01-05 DIAGNOSIS — Z7982 Long term (current) use of aspirin: Secondary | ICD-10-CM | POA: Insufficient documentation

## 2018-01-05 HISTORY — DX: Calculus of kidney: N20.0

## 2018-01-05 LAB — CBC WITH DIFFERENTIAL/PLATELET
Abs Immature Granulocytes: 0.03 10*3/uL (ref 0.00–0.07)
BASOS PCT: 0 %
Basophils Absolute: 0 10*3/uL (ref 0.0–0.1)
EOS ABS: 0 10*3/uL (ref 0.0–0.5)
EOS PCT: 0 %
HCT: 38.1 % (ref 36.0–46.0)
Hemoglobin: 11.9 g/dL — ABNORMAL LOW (ref 12.0–15.0)
Immature Granulocytes: 0 %
Lymphocytes Relative: 5 %
Lymphs Abs: 0.5 10*3/uL — ABNORMAL LOW (ref 0.7–4.0)
MCH: 28.8 pg (ref 26.0–34.0)
MCHC: 31.2 g/dL (ref 30.0–36.0)
MCV: 92.3 fL (ref 80.0–100.0)
MONO ABS: 0.9 10*3/uL (ref 0.1–1.0)
MONOS PCT: 9 %
Neutro Abs: 8.1 10*3/uL — ABNORMAL HIGH (ref 1.7–7.7)
Neutrophils Relative %: 86 %
PLATELETS: 165 10*3/uL (ref 150–400)
RBC: 4.13 MIL/uL (ref 3.87–5.11)
RDW: 12.7 % (ref 11.5–15.5)
WBC: 9.5 10*3/uL (ref 4.0–10.5)
nRBC: 0 % (ref 0.0–0.2)

## 2018-01-05 LAB — URINALYSIS, ROUTINE W REFLEX MICROSCOPIC
BILIRUBIN URINE: NEGATIVE
Glucose, UA: NEGATIVE mg/dL
Hgb urine dipstick: NEGATIVE
Ketones, ur: NEGATIVE mg/dL
LEUKOCYTES UA: NEGATIVE
NITRITE: NEGATIVE
PROTEIN: NEGATIVE mg/dL
Specific Gravity, Urine: 1.009 (ref 1.005–1.030)
pH: 8 (ref 5.0–8.0)

## 2018-01-05 LAB — COMPREHENSIVE METABOLIC PANEL
ALBUMIN: 3.3 g/dL — AB (ref 3.5–5.0)
ALT: 15 U/L (ref 0–44)
ANION GAP: 8 (ref 5–15)
AST: 18 U/L (ref 15–41)
Alkaline Phosphatase: 45 U/L (ref 38–126)
BILIRUBIN TOTAL: 1 mg/dL (ref 0.3–1.2)
BUN: 12 mg/dL (ref 8–23)
CO2: 25 mmol/L (ref 22–32)
Calcium: 8.2 mg/dL — ABNORMAL LOW (ref 8.9–10.3)
Chloride: 98 mmol/L (ref 98–111)
Creatinine, Ser: 0.63 mg/dL (ref 0.44–1.00)
GLUCOSE: 132 mg/dL — AB (ref 70–99)
POTASSIUM: 3.8 mmol/L (ref 3.5–5.1)
Sodium: 131 mmol/L — ABNORMAL LOW (ref 135–145)
TOTAL PROTEIN: 5.6 g/dL — AB (ref 6.5–8.1)

## 2018-01-05 LAB — LIPASE, BLOOD: LIPASE: 23 U/L (ref 11–51)

## 2018-01-05 MED ORDER — IOPAMIDOL (ISOVUE-300) INJECTION 61%
30.0000 mL | Freq: Once | INTRAVENOUS | Status: AC | PRN
  Administered 2018-01-05: 30 mL via ORAL

## 2018-01-05 MED ORDER — HYDROCODONE-ACETAMINOPHEN 5-325 MG PO TABS: 1.0000 | ORAL_TABLET | Freq: Four times a day (QID) | ORAL | 0 refills | Status: DC | PRN

## 2018-01-05 MED ORDER — SODIUM CHLORIDE 0.9 % IV BOLUS
1000.0000 mL | Freq: Once | INTRAVENOUS | Status: AC
  Administered 2018-01-05: 1000 mL via INTRAVENOUS

## 2018-01-05 MED ORDER — IOPAMIDOL (ISOVUE-300) INJECTION 61%
100.0000 mL | Freq: Once | INTRAVENOUS | Status: AC | PRN
  Administered 2018-01-05: 100 mL via INTRAVENOUS

## 2018-01-05 NOTE — Discharge Instructions (Signed)
Take Tylenol for mild pain or the pain medicine prescribed for bad pain.  Do not take Tylenol together with the pain medicine prescribed as the combination can be dangerous to your liver.  Do not take the pain medicine prescribed together with clonazepam or zolpidem as the combination can interfere with breathing.  Dr. Lynne Logan office will call you to arrange for follow-up at the office to investigate the mass in your left kidney.  You do not hear from his office by Tuesday, 01/09/2018 call to schedule an appointment.  Tell office staff that Dr. Alinda Money spoke with Dr Winfred Leeds about your case

## 2018-01-05 NOTE — ED Provider Notes (Signed)
[facility] EMERGENCY DEPARTMENT Provider Note   CSN: [id number] Arrival date & time: 01/05/18  8416     History   Chief Complaint Chief Complaint  Patient presents with  . Flank Pain    HPI Rebecca Fernandez is a 62 y.o. female.  HPI Complains of left mid abdominal pain started 7 PM yesterday pain radiates to left flank.  Is constant made worse by changing positions and improved with remaining still accompanying symptoms include dry heaves.  Last bowel movement was yesterday, "hard" no fever.  No treatment prior to coming here.  No other associated symptoms.  She has not had pain similar to this before Past Medical History:  Diagnosis Date  . Kidney stones     Patient Active Problem List   Diagnosis Date Noted  . Palpitations 12/05/2017  . Dyspnea on exertion 12/05/2017  . Coronary artery calcification seen on CT scan 12/05/2017  . 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 PLANTAR FACIITIS 07/08/2008    Past Surgical History:  Procedure Laterality Date  . CATARACT EXTRACTION    . FOOT SURGERY    . KIDNEY STONE SURGERY    . LASIK    . PALATE / UVULA BIOPSY / EXCISION       OB History   None      Home Medications    Prior to Admission medications   Medication Sig Start Date End Date Taking? Authorizing Provider  aspirin EC 81 MG tablet Take 81 mg by mouth daily.   Yes [provider]  buPROPion (WELLBUTRIN XL) 300 MG 24 hr tablet Take 1 tablet by mouth daily. 12/20/17  Yes [provider]  clonazePAM (KLONOPIN) 1 MG tablet Take 1 tablet by mouth 2 (two) times daily as needed. 12/20/17  Yes [provider]  diclofenac (VOLTAREN) 75 MG EC tablet Take 1 tablet by mouth daily as needed.  10/21/17  Yes [provider]  estradiol (ESTRACE) 0.5 MG tablet Take 0.5 mg by mouth daily.    Yes [provider]  Loratadine 10 MG CAPS Take 1 capsule by mouth daily.   Yes [provider]  VITAMIN A PO Take 1 tablet by mouth 2 (two)  times daily.   Yes [provider]  XIIDRA 5 % SOLN Place 1 drop into both eyes 2 (two) times daily. 12/20/17  Yes [provider]  zolpidem (AMBIEN) 10 MG tablet Take 10 mg by mouth at bedtime as needed.    Yes [provider]    Family History Family History  Problem Relation Age of Onset  . Heart disease Unknown   . Arthritis Unknown   . Lung disease Unknown   . Cancer Unknown     Social History Social History   Tobacco Use  . Smoking status: Never Smoker  . Smokeless tobacco: Never Used  Substance Use Topics  . Alcohol use: No    Comment: occasionally  . Drug use: No     Allergies   Patient has no known allergies.   Review of Systems Review of Systems  Constitutional: Negative.   HENT: Negative.   Respiratory: Negative.   Cardiovascular: Negative.   Gastrointestinal: Positive for abdominal pain and nausea.  Genitourinary: Positive for flank pain.  Skin: Negative.   Neurological: Negative.   Psychiatric/Behavioral: Negative.   All other systems reviewed and are negative.    Physical Exam Updated Vital Signs BP 93/62 (BP Location: Left Arm)   Pulse 94   Temp 97.7 F (36.5  C) (Oral)   Resp 17   Ht 5\' 1"  (1.549 m)   Wt 50.8 kg   SpO2 99%   BMI 21.16 kg/m   Physical Exam  Constitutional: She appears well-developed and well-nourished.  HENT:  Head: Normocephalic and atraumatic.  Eyes: Pupils are equal, round, and reactive to light. Conjunctivae are normal.  Neck: Neck supple. No tracheal deviation present. No thyromegaly present.  Cardiovascular: Normal rate and regular rhythm.  No murmur heard. Pulmonary/Chest: Effort normal and breath sounds normal.  Abdominal: Soft. Bowel sounds are normal. She exhibits no distension. There is no tenderness.  Musculoskeletal: Normal range of motion. She exhibits no edema or tenderness.  Neurological: She is alert. Coordination normal.  Skin: Skin is warm and dry. No rash noted.    Psychiatric: She has a normal mood and affect.  Nursing note and vitals reviewed.    ED Treatments / Results  Labs (all labs ordered are listed, but only abnormal results are displayed) Labs Reviewed  URINALYSIS, ROUTINE W REFLEX MICROSCOPIC  CBC WITH DIFFERENTIAL/PLATELET  COMPREHENSIVE METABOLIC PANEL  LIPASE, BLOOD  URINALYSIS, ROUTINE W REFLEX MICROSCOPIC    EKG None  Radiology No results found.  Procedures Procedures (including critical care time)  Medications Ordered in ED Medications - No data to display  Results for orders placed or performed during the hospital encounter of 01/05/18  CBC with Differential  Result Value Ref Range   WBC 9.5 4.0 - 10.5 K/uL   RBC 4.13 3.87 - 5.11 MIL/uL   Hemoglobin 11.9 (L) 12.0 - 15.0 g/dL   HCT 38.1 36.0 - 46.0 %   MCV 92.3 80.0 - 100.0 fL   MCH 28.8 26.0 - 34.0 pg   MCHC 31.2 30.0 - 36.0 g/dL   RDW 12.7 11.5 - 15.5 %   Platelets 165 150 - 400 K/uL   nRBC 0.0 0.0 - 0.2 %   Neutrophils Relative % 86 %   Neutro Abs 8.1 (H) 1.7 - 7.7 K/uL   Lymphocytes Relative 5 %   Lymphs Abs 0.5 (L) 0.7 - 4.0 K/uL   Monocytes Relative 9 %   Monocytes Absolute 0.9 0.1 - 1.0 K/uL   Eosinophils Relative 0 %   Eosinophils Absolute 0.0 0.0 - 0.5 K/uL   Basophils Relative 0 %   Basophils Absolute 0.0 0.0 - 0.1 K/uL   Immature Granulocytes 0 %   Abs Immature Granulocytes 0.03 0.00 - 0.07 K/uL  Comprehensive metabolic panel  Result Value Ref Range   Sodium 131 (L) 135 - 145 mmol/L   Potassium 3.8 3.5 - 5.1 mmol/L   Chloride 98 98 - 111 mmol/L   CO2 25 22 - 32 mmol/L   Glucose, Bld 132 (H) 70 - 99 mg/dL   BUN 12 8 - 23 mg/dL   Creatinine, Ser 0.63 0.44 - 1.00 mg/dL   Calcium 8.2 (L) 8.9 - 10.3 mg/dL   Total Protein 5.6 (L) 6.5 - 8.1 g/dL   Albumin 3.3 (L) 3.5 - 5.0 g/dL   AST 18 15 - 41 U/L   ALT 15 0 - 44 U/L   Alkaline Phosphatase 45 38 - 126 U/L   Total Bilirubin 1.0 0.3 - 1.2 mg/dL   GFR calc non Af Amer >60 >60 mL/min   GFR  calc Af Amer >60 >60 mL/min   Anion gap 8 5 - 15  Lipase, blood  Result Value Ref Range   Lipase 23 11 - 51 U/L  Urinalysis, Routine w reflex microscopic  Result Value Ref Range   Color, Urine STRAW (A) YELLOW   APPearance CLEAR CLEAR   Specific Gravity, Urine 1.009 1.005 - 1.030   pH 8.0 5.0 - 8.0   Glucose, UA NEGATIVE NEGATIVE mg/dL   Hgb urine dipstick NEGATIVE NEGATIVE   Bilirubin Urine NEGATIVE NEGATIVE   Ketones, ur NEGATIVE NEGATIVE mg/dL   Protein, ur NEGATIVE NEGATIVE mg/dL   Nitrite NEGATIVE NEGATIVE   Leukocytes, UA NEGATIVE NEGATIVE   Ct Abdomen Pelvis W Contrast  Result Date: 01/05/2018 CLINICAL DATA:  Left flank pain and nausea since last night EXAM: CT ABDOMEN AND PELVIS WITH CONTRAST TECHNIQUE: Multidetector CT imaging of the abdomen and pelvis was performed using the standard protocol following bolus administration of intravenous contrast. CONTRAST:  157mL ISOVUE-300 IOPAMIDOL (ISOVUE-300) INJECTION 61%, 59mL ISOVUE-300 IOPAMIDOL (ISOVUE-300) INJECTION 61% COMPARISON:  CT abdomen pelvis September 12, 2010, PET-CT October 13, 2017 FINDINGS: Lower chest: There are small calcified granulomas in the visualized right lung base. No other pulmonary mass or nodule are noted. The heart size is normal. Hepatobiliary: Several low-density lesions are identified within the liver consistent with liver cysts, largest measures 1 cm near the dome of liver. The gallbladder is normal. The biliary tree is normal. Pancreas: Unremarkable. No pancreatic ductal dilatation or surrounding inflammatory changes. Spleen: Calcified granulomas are identified within the spleen. Adrenals/Urinary Tract: There is a heterogeneous mass with nodular enhancement measuring 6 x 6.6 x 5.7 cm in the lower pole of left kidney. There are small simple cysts in both kidneys. Bilateral parapelvic renal cysts are noted. The bladder is markedly distended with fluid. Stomach/Bowel: Stomach is within normal limits. Appendix appears  normal. No evidence of bowel wall thickening, distention, or inflammatory changes. Vascular/Lymphatic: There are small subcentimeter lymph nodes in the periaortic region probably not changed compared prior exams. Atherosclerosis of the aorta is identified without aneurysmal dilatation. Reproductive: Status post hysterectomy. No adnexal masses. Other: None. Musculoskeletal: Mild degenerative joint changes of the spine are noted. IMPRESSION: Heterogeneous solid enhancing mass in the lower pole left kidney measuring maximum of 6.6 cm suspicious for renal cell carcinoma. No acute abnormality identified in the abdomen and pelvis. Electronically Signed   By: Abelardo Diesel M.D.   On: 01/05/2018 12:23   Initial Impression / Assessment and Plan / ED Course  I have reviewed the triage vital signs and the nursing notes.  Pertinent labs & imaging results that were available during my care of the patient were reviewed by me and considered in my medical decision making (see chart for details).     Declines pain medicine or antiemetic 1:50 PM patient appears in no distress.  I discussed with patient and family, with patient's permission possibility of cancer.  I discussed case with Dr. Alinda Money over telephone who will arrange for outpatient work-up for renal mass. Lab work unremarkable. Prescription [place] Controlled Substance reporting System queried Final Clinical Impressions(s) / ED Diagnoses  Diagnosis renal mass, left side Final diagnoses:  None    ED Discharge Orders    None       Orlie Dakin, MD 01/05/18 1401

## 2018-01-05 NOTE — ED Triage Notes (Signed)
Patient complaining of left flank pain and nausea since last night. Denies dysuria, vomiting, or diarrhea.

## 2018-01-05 NOTE — ED Notes (Signed)
Pt states she does not need to urinate at this time, aware of DO  

## 2018-01-08 ENCOUNTER — Encounter (HOSPITAL_COMMUNITY): Payer: Self-pay

## 2018-01-08 ENCOUNTER — Emergency Department (HOSPITAL_COMMUNITY): Payer: Managed Care, Other (non HMO)

## 2018-01-08 ENCOUNTER — Observation Stay (HOSPITAL_COMMUNITY)
Admission: EM | Admit: 2018-01-08 | Discharge: 2018-01-09 | Disposition: A | Discharge status: Home / Self Care | Payer: Managed Care, Other (non HMO) | Attending: Urology | Admitting: Urology

## 2018-01-08 ENCOUNTER — Other Ambulatory Visit: Payer: Self-pay

## 2018-01-08 DIAGNOSIS — Z7982 Long term (current) use of aspirin: Secondary | ICD-10-CM | POA: Insufficient documentation

## 2018-01-08 DIAGNOSIS — Z809 Family history of malignant neoplasm, unspecified: Secondary | ICD-10-CM | POA: Diagnosis not present

## 2018-01-08 DIAGNOSIS — Z87442 Personal history of urinary calculi: Secondary | ICD-10-CM | POA: Insufficient documentation

## 2018-01-08 DIAGNOSIS — R1012 Left upper quadrant pain: Secondary | ICD-10-CM | POA: Diagnosis present

## 2018-01-08 DIAGNOSIS — Z79899 Other long term (current) drug therapy: Secondary | ICD-10-CM | POA: Diagnosis not present

## 2018-01-08 DIAGNOSIS — N281 Cyst of kidney, acquired: Principal | ICD-10-CM | POA: Insufficient documentation

## 2018-01-08 DIAGNOSIS — N2889 Other specified disorders of kidney and ureter: Secondary | ICD-10-CM

## 2018-01-08 LAB — SAMPLE TO BLOOD BANK

## 2018-01-08 LAB — CBC WITH DIFFERENTIAL/PLATELET
Abs Immature Granulocytes: 0.01 10*3/uL (ref 0.00–0.07)
BASOS ABS: 0 10*3/uL (ref 0.0–0.1)
Basophils Relative: 0 %
EOS ABS: 0.1 10*3/uL (ref 0.0–0.5)
Eosinophils Relative: 1 %
HEMATOCRIT: 34 % — AB (ref 36.0–46.0)
HEMOGLOBIN: 10.8 g/dL — AB (ref 12.0–15.0)
Immature Granulocytes: 0 %
LYMPHS ABS: 0.9 10*3/uL (ref 0.7–4.0)
LYMPHS PCT: 14 %
MCH: 28.4 pg (ref 26.0–34.0)
MCHC: 31.8 g/dL (ref 30.0–36.0)
MCV: 89.5 fL (ref 80.0–100.0)
Monocytes Absolute: 0.6 10*3/uL (ref 0.1–1.0)
Monocytes Relative: 9 %
NEUTROS PCT: 76 %
NRBC: 0 % (ref 0.0–0.2)
Neutro Abs: 5 10*3/uL (ref 1.7–7.7)
Platelets: 209 10*3/uL (ref 150–400)
RBC: 3.8 MIL/uL — AB (ref 3.87–5.11)
RDW: 12.7 % (ref 11.5–15.5)
WBC: 6.6 10*3/uL (ref 4.0–10.5)

## 2018-01-08 LAB — I-STAT CHEM 8, ED
BUN: 12 mg/dL (ref 8–23)
CREATININE: 0.5 mg/dL (ref 0.44–1.00)
Calcium, Ion: 1.16 mmol/L (ref 1.15–1.40)
Chloride: 95 mmol/L — ABNORMAL LOW (ref 98–111)
Glucose, Bld: 92 mg/dL (ref 70–99)
HEMATOCRIT: 34 % — AB (ref 36.0–46.0)
HEMOGLOBIN: 11.6 g/dL — AB (ref 12.0–15.0)
Potassium: 4.1 mmol/L (ref 3.5–5.1)
Sodium: 134 mmol/L — ABNORMAL LOW (ref 135–145)
TCO2: 28 mmol/L (ref 22–32)

## 2018-01-08 MED ORDER — SODIUM CHLORIDE 0.9 % IV BOLUS
500.0000 mL | Freq: Once | INTRAVENOUS | Status: AC
  Administered 2018-01-08: 500 mL via INTRAVENOUS

## 2018-01-08 MED ORDER — ONDANSETRON HCL 4 MG/2ML IJ SOLN
4.0000 mg | Freq: Once | INTRAMUSCULAR | Status: AC
  Administered 2018-01-08: 4 mg via INTRAVENOUS
  Filled 2018-01-08: qty 2

## 2018-01-08 MED ORDER — ONDANSETRON HCL 4 MG/2ML IJ SOLN
4.0000 mg | Freq: Four times a day (QID) | INTRAMUSCULAR | Status: DC | PRN
  Administered 2018-01-08: 4 mg via INTRAVENOUS
  Filled 2018-01-08: qty 2

## 2018-01-08 MED ORDER — IOPAMIDOL (ISOVUE-300) INJECTION 61%
100.0000 mL | Freq: Once | INTRAVENOUS | Status: AC | PRN
  Administered 2018-01-08: 100 mL via INTRAVENOUS

## 2018-01-08 MED ORDER — IOPAMIDOL (ISOVUE-300) INJECTION 61%
INTRAVENOUS | Status: AC
  Administered 2018-01-08: 100 mL via INTRAVENOUS
  Filled 2018-01-08: qty 100

## 2018-01-08 MED ORDER — BUPROPION HCL ER (XL) 300 MG PO TB24
300.0000 mg | ORAL_TABLET | Freq: Every day | ORAL | Status: DC
  Administered 2018-01-09: 300 mg via ORAL
  Filled 2018-01-08: qty 1

## 2018-01-08 MED ORDER — SODIUM CHLORIDE 0.9 % IJ SOLN
INTRAMUSCULAR | Status: AC
  Filled 2018-01-08: qty 50

## 2018-01-08 MED ORDER — FENTANYL CITRATE (PF) 100 MCG/2ML IJ SOLN
100.0000 ug | Freq: Once | INTRAMUSCULAR | Status: AC
  Administered 2018-01-08: 100 ug via INTRAVENOUS
  Filled 2018-01-08: qty 2

## 2018-01-08 MED ORDER — SODIUM CHLORIDE 0.45 % IV SOLN
INTRAVENOUS | Status: DC
  Administered 2018-01-08 – 2018-01-09 (×2): via INTRAVENOUS

## 2018-01-08 MED ORDER — ONDANSETRON HCL 4 MG PO TABS
4.0000 mg | ORAL_TABLET | Freq: Three times a day (TID) | ORAL | Status: DC | PRN
  Administered 2018-01-09: 4 mg via ORAL
  Filled 2018-01-08: qty 1

## 2018-01-08 MED ORDER — OXYCODONE HCL 5 MG PO TABS
5.0000 mg | ORAL_TABLET | ORAL | Status: DC | PRN
  Administered 2018-01-08 – 2018-01-09 (×2): 5 mg via ORAL
  Filled 2018-01-08 (×2): qty 1

## 2018-01-08 MED ORDER — LORAZEPAM 2 MG/ML IJ SOLN
0.5000 mg | Freq: Four times a day (QID) | INTRAMUSCULAR | Status: DC | PRN
  Administered 2018-01-08: 0.5 mg via INTRAVENOUS
  Filled 2018-01-08: qty 1

## 2018-01-08 MED ORDER — ACETAMINOPHEN 500 MG PO TABS
1000.0000 mg | ORAL_TABLET | Freq: Four times a day (QID) | ORAL | Status: DC | PRN
  Administered 2018-01-09: 1000 mg via ORAL
  Filled 2018-01-08: qty 2

## 2018-01-08 MED ORDER — HYDROMORPHONE HCL 1 MG/ML IJ SOLN: 0.5000 mg | INTRAMUSCULAR | Status: DC | PRN

## 2018-01-08 MED ORDER — ZOLPIDEM TARTRATE 5 MG PO TABS
5.0000 mg | ORAL_TABLET | Freq: Every evening | ORAL | Status: DC | PRN
  Administered 2018-01-08: 5 mg via ORAL
  Filled 2018-01-08: qty 1

## 2018-01-08 MED ORDER — LORAZEPAM 2 MG/ML IJ SOLN
1.0000 mg | Freq: Once | INTRAMUSCULAR | Status: AC
  Administered 2018-01-08: 1 mg via INTRAVENOUS
  Filled 2018-01-08: qty 1

## 2018-01-08 NOTE — ED Provider Notes (Signed)
[facility] DEPT Provider Note   CSN: [id number] Arrival date & time: 01/08/18  1021     History   Chief Complaint Chief Complaint  Patient presents with  . Flank Pain    HPI Rebecca Fernandez is a 62 y.o. female.  HPI   Patient complains of left upper and lower abdominal pain, with left flank pain for several days, it is worsening.  She was seen in the ED on 01/05/2018 and diagnosed with possible renal cell carcinoma, left.  Today she saw a urologist who was concerned that she might have "internal bleeding," and she was sent here for further evaluation.  Patient denies nausea or vomiting.  She has not been hungry for several days.  She denies dizziness or weakness.  She denies fever, cough or chest pain.  There are no other no modifying factors.  Past Medical History:  Diagnosis Date  . Kidney stones     Patient Active Problem List   Diagnosis Date Noted  . Palpitations 12/05/2017  . Dyspnea on exertion 12/05/2017  . Coronary artery calcification seen on CT scan 12/05/2017  . 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 PLANTAR FACIITIS 07/08/2008    Past Surgical History:  Procedure Laterality Date  . CATARACT EXTRACTION    . FOOT SURGERY    . KIDNEY STONE SURGERY    . LASIK    . PALATE / UVULA BIOPSY / EXCISION       OB History   None      Home Medications    Prior to Admission medications   Medication Sig Start Date End Date Taking? Authorizing Provider  aspirin EC 81 MG tablet Take 81 mg by mouth daily.   Yes [provider]  buPROPion (WELLBUTRIN XL) 300 MG 24 hr tablet Take 1 tablet by mouth daily. 12/20/17  Yes [provider]  estradiol (ESTRACE) 0.5 MG tablet Take 0.5 mg by mouth daily.    Yes [provider]  HYDROcodone-acetaminophen (NORCO) 5-325 MG tablet Take 1 tablet by mouth every 6 (six) hours as needed. 01/05/18  Yes Orlie Dakin, MD  VITAMIN A PO Take 1 tablet by mouth 2 (two) times daily.    Yes [provider]  XIIDRA 5 % SOLN Place 1 drop into both eyes 2 (two) times daily. 12/20/17  Yes [provider]  zolpidem (AMBIEN) 10 MG tablet Take 10 mg by mouth at bedtime as needed.    Yes [provider]    Family History Family History  Problem Relation Age of Onset  . Heart disease Unknown   . Arthritis Unknown   . Lung disease Unknown   . Cancer Unknown     Social History Social History   Tobacco Use  . Smoking status: Never Smoker  . Smokeless tobacco: Never Used  Substance Use Topics  . Alcohol use: No    Comment: occasionally  . Drug use: No     Allergies   Patient has no known allergies.   Review of Systems Review of Systems  All other systems reviewed and are negative.    Physical Exam Updated Vital Signs BP 131/79   Pulse 97   Temp 98.4 F (36.9 C) (Oral)   Resp 13   Wt 50.8 kg   SpO2 99%   BMI 21.16 kg/m   Physical Exam  Constitutional: She is oriented to person, place, and time. She appears well-developed and well-nourished. She appears distressed (She is uncomfortable).  HENT:  Head:  Normocephalic and atraumatic.  Eyes: Pupils are equal, round, and reactive to light. Conjunctivae and EOM are normal.  Neck: Normal range of motion and phonation normal. Neck supple.  Cardiovascular: Normal rate and regular rhythm.  Pulmonary/Chest: Effort normal and breath sounds normal. She exhibits no tenderness.  Abdominal: Soft. She exhibits no distension and no mass. There is tenderness (Left upper and lower quadrants, mild). There is no guarding.  Musculoskeletal: Normal range of motion.  Neurological: She is alert and oriented to person, place, and time. She exhibits normal muscle tone.  Skin: Skin is warm and dry.  Psychiatric: She has a normal mood and affect. Her behavior is normal. Judgment and thought content normal.  Nursing note and vitals reviewed.    ED Treatments / Results  Labs (all labs ordered are  listed, but only abnormal results are displayed) Labs Reviewed  CBC WITH DIFFERENTIAL/PLATELET - Abnormal; Notable for the following components:      Result Value   RBC 3.80 (*)    Hemoglobin 10.8 (*)    HCT 34.0 (*)    All other components within normal limits  I-STAT CHEM 8, ED - Abnormal; Notable for the following components:   Sodium 134 (*)    Chloride 95 (*)    Hemoglobin 11.6 (*)    HCT 34.0 (*)    All other components within normal limits  SAMPLE TO BLOOD BANK    EKG None  Radiology Ct Abdomen Pelvis W Contrast  Result Date: 01/08/2018 CLINICAL DATA:  Recent diagnosis of left-sided renal cyst with worsening pain. Evaluate for hemorrhage. EXAM: CT ABDOMEN AND PELVIS WITH CONTRAST TECHNIQUE: Multidetector CT imaging of the abdomen and pelvis was performed using the standard protocol following bolus administration of intravenous contrast. CONTRAST:  132mL ISOVUE-300 IOPAMIDOL (ISOVUE-300) INJECTION 61% COMPARISON:  01/05/2018; 09/12/2010; PET-CT-10/13/2017 FINDINGS: Lower chest: Limited visualization of the lower thorax demonstrates punctate granuloma within the bilateral lower lobes (representative image 12, series 6). Minimal subsegmental atelectasis within the left lower lobe. No focal airspace opacities. No pleural effusion. Normal heart size.  No pericardial effusion. Hepatobiliary: Normal hepatic contour. Scattered punctate subcentimeter hypoattenuating hepatic lesions are too small to adequately characterize though phase urge to represent hepatic cysts. Normal appearance of the gallbladder given degree distention. No radiopaque gallstones. No intra extrahepatic biliary duct dilatation. No ascites. Pancreas: Normal appearance of the pancreas Spleen: Punctate granuloma within otherwise normal-appearing spleen Adrenals/Urinary Tract: No change to slight increase in size of the now approximately 7.7 x 6.3 x 6.4 cm mass arising from the inferior pole of the right kidney (coronal image  31, series 4, axial image 36, series 2), previously, 6.2 x 5.9 x 6.3 cm. No change to very slight increase in the amount of surrounding hemorrhage. Minimal increase in amount of simple attenuating fluid within the pelvic cul-de-sac (image 64, series 2) Re demonstrated mixed attenuating blood within this partially exophytic cyst without definitive internal enhancement (representative image 34, series 2; image 25, series 7). No contrast extravasation. Additional left-sided hypoattenuating renal lesions are too small to accurately characterize though favored to represent additional renal cysts. Punctate (approximately 2 mm) nonobstructing stone within the inferior pole of the left kidney (image 30, series 2). No urinary obstruction. Stomach/Bowel: Ingested enteric contrast extends to the level of the rectum. No evidence of enteric obstruction. Normal appearance of the terminal ileum. The appendix is not visualized however there is no definitive pericecal inflammatory change. No pneumoperitoneum, pneumatosis or portal venous gas. Vascular/Lymphatic: Scattered atherosclerotic plaque within normal  caliber abdominal aorta. The major branch vessels of the abdominal aorta appear patent on this non CTA examination. No bulky retroperitoneal, mesenteric, pelvic or inguinal lymphadenopathy. Reproductive: Post hysterectomy.  No discrete adnexal lesion. Other: Regional soft tissues appear normal. Musculoskeletal: No acute or aggressive osseous abnormalities. Re demonstrated mild pectus deformity. IMPRESSION: 1. No change to very slight increase in size of hemorrhagic nonenhancing left-sided renal cyst with associated very minimal amount of surrounding hemorrhage and minimal amount of simple fluid within the pelvic cul-de-sac. No contrast extravasation. 2. Punctate (approximately 2 mm) nonobstructing left-sided renal stone. 3. Aortic Atherosclerosis (ICD10-I70.0). Electronically Signed   By: Sandi Mariscal M.D.   On: 01/08/2018 15:06      Procedures Procedures (including critical care time)  Medications Ordered in ED Medications  sodium chloride 0.9 % injection (has no administration in time range)  fentaNYL (SUBLIMAZE) injection 100 mcg (100 mcg Intravenous Given 01/08/18 1153)  ondansetron (ZOFRAN) injection 4 mg (4 mg Intravenous Given 01/08/18 1152)  sodium chloride 0.9 % bolus 500 mL (500 mLs Intravenous New Bag/Given 01/08/18 1258)  iopamidol (ISOVUE-300) 61 % injection 100 mL (100 mLs Intravenous Contrast Given 01/08/18 1433)  ondansetron (ZOFRAN) injection 4 mg (4 mg Intravenous Given 01/08/18 1527)     Initial Impression / Assessment and Plan / ED Course  I have reviewed the triage vital signs and the nursing notes.  Pertinent labs & imaging results that were available during my care of the patient were reviewed by me and considered in my medical decision making (see chart for details).  Clinical Course as of Jan 09 1532  Mon Jan 08, 2018  1248 TCO2: 28 [EW]  1250 Normal except sodium low, chloride low, hemoglobin low  I-stat Chem 8, ED(!) [EW]  1251 Hemoglobin is 0.3 g down from 3 days ago.   [EW]  3149 Left renal cyst, and associated mild hemorrhage, unchanged compared with recent CT scan.  CT Abdomen Pelvis W Contrast [EW]    Clinical Course User Index [EW] Daleen Bo, MD     Patient Vitals for the past 24 hrs:  BP Temp Temp src Pulse Resp SpO2 Weight  01/08/18 1509 131/79 - - 97 13 99 % -  01/08/18 1400 124/74 - - 93 14 99 % -  01/08/18 1330 131/78 - - 95 15 100 % -  01/08/18 1300 122/65 - - 82 (!) 22 100 % -  01/08/18 1230 119/78 - - 93 16 97 % -  01/08/18 1200 130/74 - - 99 13 90 % -  01/08/18 1130 129/73 - - 87 12 100 % -  01/08/18 1101 (!) 141/70 - - 94 (!) 21 100 % -  01/08/18 1035 117/81 98.4 F (36.9 C) Oral 100 18 100 % -  01/08/18 1033 - - - - - - 50.8 kg    3:29 PM Reevaluation with update and discussion. After initial assessment and treatment, an updated evaluation  reveals she has developed nausea and now been treated with Zofran.Daleen Bo   Medical Decision Making: Abdominal pain, nonspecific with newly diagnosed left renal cyst felt to be renal carcinoma.  Valuation proceeding from urologic standpoint.  No acute intra-abdominal abnormalities requiring hospitalization at this time.  CRITICAL CARE-no Performed by: Daleen Bo  Nursing Notes Reviewed/ Care Coordinated Applicable Imaging Reviewed Interpretation of Laboratory Data incorporated into ED treatment   Urology contacted to discuss treatment plan.  I suspect that the patient can go home on Zofran, oral narcotic, and Ativan.  Final Clinical  Impressions(s) / ED Diagnoses   Final diagnoses:  Left upper quadrant pain    ED Discharge Orders    None       Daleen Bo, MD 01/08/18 1605

## 2018-01-08 NOTE — Progress Notes (Signed)
Received patient from ED, VS obtained, telemetry applied

## 2018-01-08 NOTE — ED Notes (Signed)
Pt reports feeling really nauseous after returning from CT. MD made aware. Will give Zofran.

## 2018-01-08 NOTE — ED Notes (Signed)
Bed: PE94 Expected date:  Expected time:  Means of arrival:  Comments: Hold for [place]

## 2018-01-08 NOTE — ED Notes (Signed)
Per Dr Dayna Ramus, [phone] patient was seen on 10/11-CT showed possible mass/cyst on left kidney-seen in clinic today-states BP low and elevated HR-thinks she might have a bleed-wants to be kept informed of patient's treatment-sending her for labs and a repeat CT

## 2018-01-08 NOTE — ED Notes (Signed)
ED TO INPATIENT HANDOFF REPORT  Name/Age/Gender Rebecca Fernandez 62 y.o. female  Code Status   Home/SNF/Other Home  Chief Complaint possible bleed  Level of Care/Admitting Diagnosis ED Disposition    ED Disposition Condition Comment   Admit  Hospital Area: [facility] [[id number]]  Level of Care: Med-Surg [16]  Diagnosis: Renal cyst, native, hemorrhage [185631]  Admitting Physician: Ardis Hughs [[id number]]  Attending Physician: Ardis Hughs [[id number]]  PT Class (Do Not Modify): Observation [104]  PT Acc Code (Do Not Modify): Observation [10022]       Medical History Past Medical History:  Diagnosis Date  . Kidney stones     Allergies No Known Allergies  IV Location/Drains/Wounds Patient Lines/Drains/Airways Status   Active Line/Drains/Airways    Name:   Placement date:   Placement time:   Site:   Days:   Peripheral IV 01/08/18 Right Antecubital   01/08/18    1149    Antecubital   less than 1          Labs/Imaging Results for orders placed or performed during the hospital encounter of 01/08/18 (from the past 66 hour(s))  Sample to Blood Bank     Status: None   Collection Time: 01/08/18 11:58 AM  Result Value Ref Range   Blood Bank Specimen SAMPLE AVAILABLE FOR TESTING    Sample Expiration      01/11/2018 Performed at [facility], [place] [address] [place], [place] [address]   I-stat Chem 8, ED     Status: Abnormal   Collection Time: 01/08/18 12:03 PM  Result Value Ref Range   Sodium 134 (L) 135 - 145 mmol/L   Potassium 4.1 3.5 - 5.1 mmol/L   Chloride 95 (L) 98 - 111 mmol/L   BUN 12 8 - 23 mg/dL   Creatinine, Ser 0.50 0.44 - 1.00 mg/dL   Glucose, Bld 92 70 - 99 mg/dL   Calcium, Ion 1.16 1.15 - 1.40 mmol/L   TCO2 28 22 - 32 mmol/L   Hemoglobin 11.6 (L) 12.0 - 15.0 g/dL   HCT 34.0 (L) 36.0 - 46.0 %  CBC with Differential     Status: Abnormal   Collection Time: 01/08/18 12:49 PM  Result Value Ref Range   WBC 6.6 4.0 - 10.5 K/uL   RBC 3.80 (L) 3.87 - 5.11 MIL/uL   Hemoglobin 10.8 (L) 12.0 - 15.0 g/dL   HCT 34.0 (L) 36.0 - 46.0 %   MCV 89.5 80.0 - 100.0 fL   MCH 28.4 26.0 - 34.0 pg   MCHC 31.8 30.0 - 36.0 g/dL   RDW 12.7 11.5 - 15.5 %   Platelets 209 150 - 400 K/uL   nRBC 0.0 0.0 - 0.2 %   Neutrophils Relative % 76 %   Neutro Abs 5.0 1.7 - 7.7 K/uL   Lymphocytes Relative 14 %   Lymphs Abs 0.9 0.7 - 4.0 K/uL   Monocytes Relative 9 %   Monocytes Absolute 0.6 0.1 - 1.0 K/uL   Eosinophils Relative 1 %   Eosinophils Absolute 0.1 0.0 - 0.5 K/uL   Basophils Relative 0 %   Basophils Absolute 0.0 0.0 - 0.1 K/uL   Immature Granulocytes 0 %   Abs Immature Granulocytes 0.01 0.00 - 0.07 K/uL    Comment: Performed at [facility], [place] [address] [place], [place] [address]   Ct Abdomen Pelvis W Contrast  Result Date: 01/08/2018 CLINICAL DATA:  Recent diagnosis of left-sided renal cyst with  worsening pain. Evaluate for hemorrhage. EXAM: CT ABDOMEN AND PELVIS WITH CONTRAST TECHNIQUE: Multidetector CT imaging of the abdomen and pelvis was performed using the standard protocol following bolus administration of intravenous contrast. CONTRAST:  153mL ISOVUE-300 IOPAMIDOL (ISOVUE-300) INJECTION 61% COMPARISON:  01/05/2018; 09/12/2010; PET-CT-10/13/2017 FINDINGS: Lower chest: Limited visualization of the lower thorax demonstrates punctate granuloma within the bilateral lower lobes (representative image 12, series 6). Minimal subsegmental atelectasis within the left lower lobe. No focal airspace opacities. No pleural effusion. Normal heart size.  No pericardial effusion. Hepatobiliary: Normal hepatic contour. Scattered punctate subcentimeter hypoattenuating hepatic lesions are too small to adequately characterize though phase urge to represent hepatic cysts. Normal appearance of the gallbladder given degree distention. No radiopaque gallstones. No intra extrahepatic biliary duct dilatation. No  ascites. Pancreas: Normal appearance of the pancreas Spleen: Punctate granuloma within otherwise normal-appearing spleen Adrenals/Urinary Tract: No change to slight increase in size of the now approximately 7.7 x 6.3 x 6.4 cm mass arising from the inferior pole of the right kidney (coronal image 31, series 4, axial image 36, series 2), previously, 6.2 x 5.9 x 6.3 cm. No change to very slight increase in the amount of surrounding hemorrhage. Minimal increase in amount of simple attenuating fluid within the pelvic cul-de-sac (image 64, series 2) Re demonstrated mixed attenuating blood within this partially exophytic cyst without definitive internal enhancement (representative image 34, series 2; image 25, series 7). No contrast extravasation. Additional left-sided hypoattenuating renal lesions are too small to accurately characterize though favored to represent additional renal cysts. Punctate (approximately 2 mm) nonobstructing stone within the inferior pole of the left kidney (image 30, series 2). No urinary obstruction. Stomach/Bowel: Ingested enteric contrast extends to the level of the rectum. No evidence of enteric obstruction. Normal appearance of the terminal ileum. The appendix is not visualized however there is no definitive pericecal inflammatory change. No pneumoperitoneum, pneumatosis or portal venous gas. Vascular/Lymphatic: Scattered atherosclerotic plaque within normal caliber abdominal aorta. The major branch vessels of the abdominal aorta appear patent on this non CTA examination. No bulky retroperitoneal, mesenteric, pelvic or inguinal lymphadenopathy. Reproductive: Post hysterectomy.  No discrete adnexal lesion. Other: Regional soft tissues appear normal. Musculoskeletal: No acute or aggressive osseous abnormalities. Re demonstrated mild pectus deformity. IMPRESSION: 1. No change to very slight increase in size of hemorrhagic nonenhancing left-sided renal cyst with associated very minimal amount of  surrounding hemorrhage and minimal amount of simple fluid within the pelvic cul-de-sac. No contrast extravasation. 2. Punctate (approximately 2 mm) nonobstructing left-sided renal stone. 3. Aortic Atherosclerosis (ICD10-I70.0). Electronically Signed   By: Sandi Mariscal M.D.   On: 01/08/2018 15:06    Pending Labs Unresulted Labs (From admission, onward)    Start     Ordered   01/09/18 0500  CBC  Tomorrow morning,   R     01/08/18 1734   01/09/18 1610  Basic metabolic panel  Tomorrow morning,   R     01/08/18 1734          Vitals/Pain Today's Vitals   01/08/18 1630 01/08/18 1700 01/08/18 1730 01/08/18 1800  BP: 129/79 131/76 135/78 136/80  Pulse: 88 88 91 100  Resp: 12 17 15 18   Temp:      TempSrc:      SpO2: 98% 98% 98% 100%  Weight:      PainSc:        Isolation Precautions No active isolations  Medications Medications  sodium chloride 0.9 % injection (has no administration in time range)  zolpidem (AMBIEN) tablet 5 mg (has no administration in time range)  buPROPion (WELLBUTRIN XL) 24 hr tablet 300 mg (has no administration in time range)  oxyCODONE (Oxy IR/ROXICODONE) immediate release tablet 5-10 mg (has no administration in time range)  acetaminophen (TYLENOL) tablet 1,000 mg (has no administration in time range)  0.45 % sodium chloride infusion (has no administration in time range)  ondansetron (ZOFRAN) tablet 4 mg (has no administration in time range)  ondansetron (ZOFRAN) injection 4 mg (has no administration in time range)  HYDROmorphone (DILAUDID) injection 0.5 mg (has no administration in time range)  fentaNYL (SUBLIMAZE) injection 100 mcg (100 mcg Intravenous Given 01/08/18 1153)  ondansetron (ZOFRAN) injection 4 mg (4 mg Intravenous Given 01/08/18 1152)  sodium chloride 0.9 % bolus 500 mL (0 mLs Intravenous Stopped 01/08/18 1540)  iopamidol (ISOVUE-300) 61 % injection 100 mL (100 mLs Intravenous Contrast Given 01/08/18 1433)  ondansetron (ZOFRAN) injection 4 mg  (4 mg Intravenous Given 01/08/18 1527)  fentaNYL (SUBLIMAZE) injection 100 mcg (100 mcg Intravenous Given 01/08/18 1543)  LORazepam (ATIVAN) injection 1 mg (1 mg Intravenous Given 01/08/18 1556)    Mobility walks

## 2018-01-08 NOTE — ED Notes (Signed)
Bladder scan showed 110 mL.

## 2018-01-08 NOTE — ED Notes (Signed)
Hold tubes sent to lab (lav, lt green, blue).

## 2018-01-08 NOTE — ED Notes (Signed)
Pt ambulatory to bathroom without assistance or difficulty.

## 2018-01-08 NOTE — ED Notes (Signed)
Pt reports 10/10 headache at this time. MD made aware. Will give pain medicine.

## 2018-01-08 NOTE — ED Notes (Signed)
Transport called to take pt up. 

## 2018-01-08 NOTE — ED Triage Notes (Signed)
Pt reports being seen at [facility] on Friday and dx with a cyst or mass on left kidney. Pt reports worsening pain over the weekend. Pt seen at PCP office this am and sent to ED to rule out bleeding from cyst.

## 2018-01-08 NOTE — ED Provider Notes (Signed)
Patient excepted from Dr. Eulis Foster at shift change.  CT completed.  Awaiting return call from Dr. Louis Meckel for plan and disposition. Physical Exam  BP 131/76   Pulse 88   Temp 98.4 F (36.9 C) (Oral)   Resp 17   Wt 50.8 kg   SpO2 98%   BMI 21.16 kg/m   Physical Exam Patient is alert and nontoxic.  No respiratory distress.  Mental status clear. ED Course/Procedures   Clinical Course as of Jan 09 1716  Mon Jan 08, 2018  1248 TCO2: 28 [EW]  1250 Normal except sodium low, chloride low, hemoglobin low  I-stat Chem 8, ED(!) [EW]  1251 Hemoglobin is 0.3 g down from 3 days ago.   [EW]  6606 Left renal cyst, and associated mild hemorrhage, unchanged compared with recent CT scan.  CT Abdomen Pelvis W Contrast [EW]  0045 Consult:Dr. Louis Meckel. Work on pain control and he will consult in the ED.   [MP]    Clinical Course User Index [EW] Daleen Bo, MD [MP] Charlesetta Shanks, MD    Procedures   Upon patient assessment she is alert and appropriate.  Patient denies any need for additional pain control.  Patient advised to use her nurse call button and let them know if she has any need for additional pain control or other needs while awaiting consultation.  We will continue n.p.o. until seen by Dr. Louis Meckel.  MDM  Reviewed case with Dr. Louis Meckel and reviewed CT scan.  He advises to proceed with pain control as needed and he will evaluate the patient emergency department for final disposition.      Charlesetta Shanks, MD 01/08/18 [phone]

## 2018-01-08 NOTE — H&P (Signed)
Cc: hemorrhagic renal cyst  History of present illness: 62 year old female who presented to the urology clinic today urgently for evaluation of left-sided renal mass. The patient was seen in the emergency department on Friday (3 days prior) for acute onset left-sided flank pain and nausea and vomiting. CT scan demonstrated a 6 cm inhomogeneous left renal mass. This was declared a cyst on a 2012 CT and measured 44.5 cm at that time. In the ED she was treated for her pain, scheduled for f/u and discharged home.  Since she was last seen she notes severe pain, worsening. She also is starting to feel weaker. She is having associated nausea and vomiting. She denies any fevers.   She was sent to the ED.  In the ED a repeat CT scan showed an expanding renal mass with no evidence of active contrast extravasation.  Her vitals were otherwise stable once her pain was controlled.  Her hgb was slightly lower.  The patient has a strong family history of cancer on both sides.   Review of systems: A 12 point comprehensive review of systems was obtained and is negative unless otherwise stated in the history of present illness.  Patient Active Problem List   Diagnosis Date Noted  . Renal cyst, native, hemorrhage 01/08/2018  . Palpitations 12/05/2017  . Dyspnea on exertion 12/05/2017  . Coronary artery calcification seen on CT scan 12/05/2017  . 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 PLANTAR FACIITIS 07/08/2008    Current Facility-Administered Medications on File Prior to Encounter  Medication Dose Route Frequency Provider Last Rate Last Dose  . technetium pyrophosphate Tc 29m injection 8.8 millicurie  8.8 millicurie Intravenous Once Larey Dresser, MD       Current Outpatient Medications on File Prior to Encounter  Medication Sig Dispense Refill  . aspirin EC 81 MG tablet Take 81 mg by mouth daily.    Marland Kitchen buPROPion (WELLBUTRIN XL) 300 MG 24 hr tablet Take 1 tablet by mouth daily.    Marland Kitchen estradiol (ESTRACE) 0.5  MG tablet Take 0.5 mg by mouth daily.     Marland Kitchen HYDROcodone-acetaminophen (NORCO) 5-325 MG tablet Take 1 tablet by mouth every 6 (six) hours as needed. 20 tablet 0  . VITAMIN A PO Take 1 tablet by mouth 2 (two) times daily.    Marland Kitchen XIIDRA 5 % SOLN Place 1 drop into both eyes 2 (two) times daily.    Marland Kitchen zolpidem (AMBIEN) 10 MG tablet Take 10 mg by mouth at bedtime as needed.       Past Medical History:  Diagnosis Date  . Kidney stones     Past Surgical History:  Procedure Laterality Date  . CATARACT EXTRACTION    . FOOT SURGERY    . KIDNEY STONE SURGERY    . LASIK    . PALATE / UVULA BIOPSY / EXCISION      Social History   Tobacco Use  . Smoking status: Never Smoker  . Smokeless tobacco: Never Used  Substance Use Topics  . Alcohol use: No    Comment: occasionally  . Drug use: No    Family History  Problem Relation Age of Onset  . Heart disease Unknown   . Arthritis Unknown   . Lung disease Unknown   . Cancer Unknown     PE: Vitals:   01/08/18 1730 01/08/18 1800 01/08/18 1845 01/08/18 1850  BP: 135/78 136/80 136/79   Pulse: 91 100 98   Resp: 15 18 20    Temp:  98.3 F (36.8 C)   TempSrc:   Oral   SpO2: 98% 100% 98%   Weight:    50.8 kg  Height:    5\' 1"  (1.549 m)   Patient appears to be in no acute distress  patient is alert and oriented x3 Atraumatic normocephalic head No cervical or supraclavicular lymphadenopathy appreciated No increased work of breathing, no audible wheezes/rhonchi Regular sinus rhythm/rate Abdomen is soft, left sided tenderness to palpation. Lower extremities are symmetric without appreciable edema Grossly neurologically intact No identifiable skin lesions  Recent Labs    01/08/18 1203 01/08/18 1249  WBC  --  6.6  HGB 11.6* 10.8*  HCT 34.0* 34.0*   Recent Labs    01/08/18 1203  NA 134*  K 4.1  CL 95*  GLUCOSE 92  BUN 12  CREATININE 0.50   No results for input(s): LABPT, INR in the last 72 hours. No results for input(s):  LABURIN in the last 72 hours. Results for orders placed or performed during the hospital encounter of 09/12/10  Urine culture     Status: None   Collection Time: 09/12/10 10:19 PM  Result Value Ref Range Status   Specimen Description URINE, CLEAN CATCH  Final   Special Requests NONE  Final   Culture  Setup Time [id number]  Final   Colony Count NO GROWTH  Final   Culture NO GROWTH  Final   Report Status 09/14/2010 FINAL  Final    Imaging: CT scan in ED: 7.7 x 6.3 x 6.4 cm mass arising from the inferior pole of the left kidney,  previously, 6.2 x 5.9 x 6.3 cm. No change to very slight increase in the amount of surrounding hemorrhage. Re demonstrated mixed attenuating blood within this partially exophytic cyst without definitive internal enhancement. No contrast  extravasation.  Imp: Patient has developed hemorrhage into what appeared to be a simple cyst in her anterior left kidney which has expanded over the last 3 days.  Her hbg has only dropped slightly, but her pain is not well controlled.  Recommendations: Admit to urology for serial hemoglobins to ensure bleeding has stopped. Transition to oral pain medications to ascertain adequate home pain regimen. Bed rest until hgb stable NPO p MN Restart home medications  Observation status.   Louis Meckel W

## 2018-01-09 LAB — CBC
HCT: 33.2 % — ABNORMAL LOW (ref 36.0–46.0)
Hemoglobin: 10.5 g/dL — ABNORMAL LOW (ref 12.0–15.0)
MCH: 28.3 pg (ref 26.0–34.0)
MCHC: 31.6 g/dL (ref 30.0–36.0)
MCV: 89.5 fL (ref 80.0–100.0)
Platelets: 214 10*3/uL (ref 150–400)
RBC: 3.71 MIL/uL — ABNORMAL LOW (ref 3.87–5.11)
RDW: 12.5 % (ref 11.5–15.5)
WBC: 5.9 10*3/uL (ref 4.0–10.5)
nRBC: 0 % (ref 0.0–0.2)

## 2018-01-09 LAB — BASIC METABOLIC PANEL
Anion gap: 9 (ref 5–15)
BUN: 9 mg/dL (ref 8–23)
CO2: 27 mmol/L (ref 22–32)
CREATININE: 0.48 mg/dL (ref 0.44–1.00)
Calcium: 8.7 mg/dL — ABNORMAL LOW (ref 8.9–10.3)
Chloride: 100 mmol/L (ref 98–111)
GFR calc Af Amer: >60 mL/min (ref >60)
Glucose, Bld: 90 mg/dL (ref 70–99)
Potassium: 4.1 mmol/L (ref 3.5–5.1)
SODIUM: 136 mmol/L (ref 135–145)

## 2018-01-09 MED ORDER — DOCUSATE SODIUM 100 MG PO CAPS: 100.0000 mg | ORAL_CAPSULE | Freq: Two times a day (BID) | ORAL | 2 refills | Status: AC

## 2018-01-09 MED ORDER — ONDANSETRON HCL 4 MG PO TABS: 4.0000 mg | ORAL_TABLET | Freq: Three times a day (TID) | ORAL | 0 refills | Status: DC | PRN

## 2018-01-09 MED ORDER — OXYCODONE HCL 5 MG PO TABS: 5.0000 mg | ORAL_TABLET | ORAL | 0 refills | Status: DC | PRN

## 2018-01-09 MED ORDER — ACETAMINOPHEN 500 MG PO TABS: 1000.0000 mg | ORAL_TABLET | Freq: Four times a day (QID) | ORAL | 0 refills | Status: DC | PRN

## 2018-01-09 NOTE — Discharge Instructions (Signed)
   Activity:  You are encouraged to ambulate frequently (about every hour during waking hours) to help prevent blood clots from forming in your legs or lungs.  However, you should not engage in any heavy lifting (> 10-15 lbs), strenuous activity, or straining.   Diet: You should advance your diet as instructed by your physician.  It will be normal to have some bloating, nausea, and abdominal discomfort intermittently.   Prescriptions:  You will be provided a prescription for pain medication to take as needed.  If your pain is not severe enough to require the prescription pain medication, you may take extra strength Tylenol instead which will have less side effects.  You should also take a prescribed stool softener to avoid straining with bowel movements as the prescription pain medication may constipate you.   What to call us about: You should call the office ([phone]) if you develop fever > 101 or develop persistent vomiting. Activity:  You are encouraged to ambulate frequently (about every hour during waking hours) to help prevent blood clots from forming in your legs or lungs.  However, you should not engage in any heavy lifting (> 10-15 lbs), strenuous activity, or straining.  

## 2018-01-09 NOTE — Discharge Summary (Signed)
Date of admission: 01/08/2018  Date of discharge: 01/09/2018  Admission diagnosis: hemorrhagic left renal cyst  Discharge diagnosis: same  Secondary diagnoses:  Patient Active Problem List   Diagnosis Date Noted  . Renal cyst, native, hemorrhage 01/08/2018  . Palpitations 12/05/2017  . Dyspnea on exertion 12/05/2017  . Coronary artery calcification seen on CT scan 12/05/2017  . 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 PLANTAR FACIITIS 07/08/2008    Procedures performed:   History and Physical: For full details, please see admission history and physical. Briefly, Rebecca Fernandez is a 62 y.o. year old patient with severe left-sided flank pain, nausea, and tachycardia.  She was found to have enlarging hemorrhagic left renal cyst.  Hospital Course: The patient was admitted from the emergency department placed in observation.  She was kept on bedrest and hemoglobins were checked to ensure stability.  There is no change in her hemoglobin and her activity levels are liberalized.  She was left n.p.o. past midnight, but given her stability she is able to eat.  Her pain was adequately managed with oral pain medication and her nausea controlled with oral antiemetic.  The patient tolerated lunch prior to discharge.  At the time of discharge she had met all discharge criteria.  She was discharged home on hospital day #2.    Laboratory values:  Recent Labs    01/08/18 1203 01/08/18 1249 01/09/18 0432  WBC  --  6.6 5.9  HGB 11.6* 10.8* 10.5*  HCT 34.0* 34.0* 33.2*   Recent Labs    01/08/18 1203 01/09/18 0432  NA 134* 136  K 4.1 4.1  CL 95* 100  CO2  --  27  GLUCOSE 92 90  BUN 12 9  CREATININE 0.50 0.48  CALCIUM  --  8.7*   No results for input(s): LABPT, INR in the last 72 hours. No results for input(s): LABURIN in the last 72 hours. Results for orders placed or performed during the hospital encounter of 09/12/10  Urine culture     Status: None   Collection Time: 09/12/10 10:19 PM   Result Value Ref Range Status   Specimen Description URINE, CLEAN CATCH  Final   Special Requests NONE  Final   Culture  Setup Time [id number]  Final   Colony Count NO GROWTH  Final   Culture NO GROWTH  Final   Report Status 09/14/2010 FINAL  Final    Disposition: Home  Discharge instruction: The patient was instructed to be ambulatory but told to refrain from heavy lifting, strenuous activity, or driving.   Discharge medications:  Allergies as of 01/09/2018   No Known Allergies     Medication List    STOP taking these medications   aspirin EC 81 MG tablet   HYDROcodone-acetaminophen 5-325 MG tablet Commonly known as:  NORCO/VICODIN     TAKE these medications   acetaminophen 500 MG tablet Commonly known as:  TYLENOL Take 2 tablets (1,000 mg total) by mouth every 6 (six) hours as needed for mild pain.   buPROPion 300 MG 24 hr tablet Commonly known as:  WELLBUTRIN XL Take 1 tablet by mouth daily.   docusate sodium 100 MG capsule Commonly known as:  COLACE Take 1 capsule (100 mg total) by mouth 2 (two) times daily.   estradiol 0.5 MG tablet Commonly known as:  ESTRACE Take 0.5 mg by mouth daily.   ondansetron 4 MG tablet Commonly known as:  ZOFRAN Take 1 tablet (4 mg total) by mouth every 8 (eight) hours  as needed for nausea or vomiting.   oxyCODONE 5 MG immediate release tablet Commonly known as:  Oxy IR/ROXICODONE Take 1-2 tablets (5-10 mg total) by mouth every 4 (four) hours as needed for severe pain.   VITAMIN A PO Take 1 tablet by mouth 2 (two) times daily.   XIIDRA 5 % Soln Generic drug:  Lifitegrast Place 1 drop into both eyes 2 (two) times daily.   zolpidem 10 MG tablet Commonly known as:  AMBIEN Take 10 mg by mouth at bedtime as needed.       Followup:  Follow-up Information    Ardis Hughs, MD Follow up in 3 month(s).   Specialty:  Urology Contact information: [address] [place] [address] [phone]

## 2018-01-09 NOTE — Progress Notes (Signed)
Admitted for left flank pain, nausea, and expanding hemorrhagic left renal cyst. HD#2  Intv: No acute events overnight.  Pain seemingly well managed with oral medications.  Getting pain medications every 6 hours.  Nausea better, covered with zofran.  Hgb stable.  PE: Vitals:   01/08/18 1850 01/08/18 2203 01/09/18 0153 01/09/18 0557  BP:  136/84 123/71 121/69  Pulse:  91 88 86  Resp:  12 16 12   Temp:  98.5 F (36.9 C) 98 F (36.7 C) 98.2 F (36.8 C)  TempSrc:  Oral Oral Oral  SpO2:  99% 100% 99%  Weight: 50.8 kg     Height: 5\' 1"  (1.549 m)       Intake/Output Summary (Last 24 hours) at 01/09/2018 0800 Last data filed at 01/09/2018 0603 Gross per 24 hour  Intake 1797.84 ml  Output 400 ml  Net 1397.84 ml   NAD Non labored breathing Abdomen soft  Ext symmetric  Recent Labs    01/08/18 1203 01/08/18 1249 01/09/18 0432  WBC  --  6.6 5.9  HGB 11.6* 10.8* 10.5*  HCT 34.0* 34.0* 33.2*   Recent Labs    01/08/18 1203 01/09/18 0432  NA 134* 136  K 4.1 4.1  CL 95* 100  CO2  --  27  GLUCOSE 92 90  BUN 12 9  CREATININE 0.50 0.48  CALCIUM  --  8.7*   No results for input(s): LABPT, INR in the last 72 hours. No results for input(s): PSA in the last 72 hours. No results for input(s): LABURIN in the last 72 hours. Results for orders placed or performed during the hospital encounter of 09/12/10  Urine culture     Status: None   Collection Time: 09/12/10 10:19 PM  Result Value Ref Range Status   Specimen Description URINE, CLEAN CATCH  Final   Special Requests NONE  Final   Culture  Setup Time [id number]  Final   Colony Count NO GROWTH  Final   Culture NO GROWTH  Final   Report Status 09/14/2010 FINAL  Final   Imp: Hemorrhage into left renal cyst -  hbg stable, pain controlled on oral pain medications, nausea improved with meds.  Plan: ADAT HLIFV Oral medications Re-eval early PM for discharge.  F/u in clinic 3 months for repeat CT scan, sooner with  worsening pain/concerns.

## 2018-01-09 NOTE — Progress Notes (Signed)
Hgb stable.  Ok to advance diet.

## 2018-01-23 ENCOUNTER — Ambulatory Visit: Payer: Managed Care, Other (non HMO) | Admitting: Cardiovascular Disease

## 2018-03-26 ENCOUNTER — Ambulatory Visit: Admit: 2018-03-26 | Payer: Managed Care, Other (non HMO) | Admitting: Urology

## 2018-03-26 SURGERY — NEPHRECTOMY, PARTIAL, ROBOT-ASSISTED
Anesthesia: General | Laterality: Left

## 2019-01-17 ENCOUNTER — Emergency Department (HOSPITAL_COMMUNITY): Payer: BC Managed Care – PPO

## 2019-01-17 ENCOUNTER — Encounter (HOSPITAL_COMMUNITY): Payer: Self-pay | Admitting: Emergency Medicine

## 2019-01-17 ENCOUNTER — Other Ambulatory Visit: Payer: Self-pay

## 2019-01-17 ENCOUNTER — Encounter (HOSPITAL_COMMUNITY): Payer: Self-pay

## 2019-01-17 ENCOUNTER — Emergency Department (HOSPITAL_COMMUNITY)
Admission: EM | Admit: 2019-01-17 | Discharge: 2019-01-17 | Disposition: A | Discharge status: Home / Self Care | Payer: BC Managed Care – PPO | Attending: Emergency Medicine | Admitting: Emergency Medicine

## 2019-01-17 ENCOUNTER — Emergency Department (HOSPITAL_COMMUNITY)
Admission: EM | Admit: 2019-01-17 | Discharge: 2019-01-18 | Disposition: A | Discharge status: Home / Self Care | Payer: BC Managed Care – PPO | Source: Home / Self Care | Attending: Emergency Medicine | Admitting: Emergency Medicine

## 2019-01-17 DIAGNOSIS — R1013 Epigastric pain: Secondary | ICD-10-CM | POA: Diagnosis not present

## 2019-01-17 DIAGNOSIS — R079 Chest pain, unspecified: Secondary | ICD-10-CM

## 2019-01-17 DIAGNOSIS — Z79899 Other long term (current) drug therapy: Secondary | ICD-10-CM | POA: Diagnosis not present

## 2019-01-17 DIAGNOSIS — R1084 Generalized abdominal pain: Secondary | ICD-10-CM

## 2019-01-17 LAB — COMPREHENSIVE METABOLIC PANEL
ALT: 25 U/L (ref 0–44)
AST: 27 U/L (ref 15–41)
Albumin: 4.2 g/dL (ref 3.5–5.0)
Alkaline Phosphatase: 55 U/L (ref 38–126)
Anion gap: 14 (ref 5–15)
BUN: 7 mg/dL — ABNORMAL LOW (ref 8–23)
CO2: 26 mmol/L (ref 22–32)
Calcium: 9.5 mg/dL (ref 8.9–10.3)
Chloride: 95 mmol/L — ABNORMAL LOW (ref 98–111)
Creatinine, Ser: 0.68 mg/dL (ref 0.44–1.00)
GFR calc Af Amer: >60 mL/min (ref >60)
GFR calc non Af Amer: >60 mL/min (ref >60)
Glucose, Bld: 93 mg/dL (ref 70–99)
Potassium: 3.5 mmol/L (ref 3.5–5.1)
Sodium: 135 mmol/L (ref 135–145)
Total Bilirubin: 0.7 mg/dL (ref 0.3–1.2)
Total Protein: 6.5 g/dL (ref 6.5–8.1)

## 2019-01-17 LAB — URINALYSIS, ROUTINE W REFLEX MICROSCOPIC
Bilirubin Urine: NEGATIVE
Glucose, UA: NEGATIVE mg/dL
Hgb urine dipstick: NEGATIVE
Ketones, ur: 20 mg/dL — AB
Leukocytes,Ua: NEGATIVE
Nitrite: NEGATIVE
Protein, ur: 100 mg/dL — AB
Specific Gravity, Urine: 1.008 (ref 1.005–1.030)
pH: 7 (ref 5.0–8.0)

## 2019-01-17 LAB — CBC
HCT: 40.5 % (ref 36.0–46.0)
Hemoglobin: 12.9 g/dL (ref 12.0–15.0)
MCH: 29.1 pg (ref 26.0–34.0)
MCHC: 31.9 g/dL (ref 30.0–36.0)
MCV: 91.4 fL (ref 80.0–100.0)
Platelets: 213 10*3/uL (ref 150–400)
RBC: 4.43 MIL/uL (ref 3.87–5.11)
RDW: 12.5 % (ref 11.5–15.5)
WBC: 5.9 10*3/uL (ref 4.0–10.5)
nRBC: 0 % (ref 0.0–0.2)

## 2019-01-17 LAB — LIPASE, BLOOD: Lipase: 21 U/L (ref 11–51)

## 2019-01-17 MED ORDER — TRAMADOL HCL 50 MG PO TABS: 50.0000 mg | ORAL_TABLET | Freq: Four times a day (QID) | ORAL | 0 refills | Status: DC | PRN

## 2019-01-17 MED ORDER — SODIUM CHLORIDE 0.9% FLUSH: 3.0000 mL | Freq: Once | INTRAVENOUS | Status: DC

## 2019-01-17 MED ORDER — IOHEXOL 300 MG/ML  SOLN
100.0000 mL | Freq: Once | INTRAMUSCULAR | Status: AC | PRN
  Administered 2019-01-17: 16:00:00 100 mL via INTRAVENOUS

## 2019-01-17 NOTE — ED Provider Notes (Signed)
[facility] EMERGENCY DEPARTMENT Provider Note   CSN: [id number] Arrival date & time: 01/17/19  0941  History    Chief Complaint  Patient presents with  . Abdominal Pain    HPI MAEBRIE BORJAS is a 63 y.o. female with no significant PMHx , who presents to the ED with continuing abdominal pain.  Patient reports that the abdominal pain started on Tuesday night (01/17/2019).  She reports seeing her PCP yesterday for pain and had some labs drawn.  There were no causative abnormalities that were noted.  Patient reports that the pain has been on the left side and hurts more with getting up and sitting up.  She reports that the pain is constant.  The pain is recently shifted to midline and is now in her epigastric and suprapubic area.  She reports her last bowel movement was this morning, which was small.  She typically has bowel movements every other day.  She has had nausea and anorexia.  She denies any fevers, chills, dysuria, vomiting.  She was diagnosed with UTI 2 weeks ago and was treated with an antibiotic.  She denies any diarrhea, constipation at this time.  Patient does report that she has had a renal cyst on the left side which she thought was the cause of the pain.  Allergies: Patient has no known allergies. Medications: No current facility-administered medications for this encounter.   Current Outpatient Medications:  .  acetaminophen (TYLENOL) 500 MG tablet, Take 2 tablets (1,000 mg total) by mouth every 6 (six) hours as needed for mild pain., Disp: 30 tablet, Rfl: 0 .  buPROPion (WELLBUTRIN XL) 300 MG 24 hr tablet, Take 1 tablet by mouth daily., Disp: , Rfl:  .  estradiol (ESTRACE) 0.5 MG tablet, Take 0.5 mg by mouth daily. , Disp: , Rfl:  .  ondansetron (ZOFRAN) 4 MG tablet, Take 1 tablet (4 mg total) by mouth every 8 (eight) hours as needed for nausea or vomiting., Disp: 20 tablet, Rfl: 0 .  oxyCODONE (OXY IR/ROXICODONE) 5 MG immediate release tablet, Take 1-2 tablets (5-10 mg total)  by mouth every 4 (four) hours as needed for severe pain., Disp: 30 tablet, Rfl: 0 .  VITAMIN A PO, Take 1 tablet by mouth 2 (two) times daily., Disp: , Rfl:  .  XIIDRA 5 % SOLN, Place 1 drop into both eyes 2 (two) times daily., Disp: , Rfl:  .  zolpidem (AMBIEN) 10 MG tablet, Take 10 mg by mouth at bedtime as needed. , Disp: , Rfl:   Facility-Administered Medications Ordered in Other Encounters:  .  technetium pyrophosphate Tc 110m injection 8.8 millicurie, 8.8 millicurie, Intravenous, Once, Larey Dresser, MD   Past Medical/Surgical History Past Medical History:  Diagnosis Date  . Kidney stones     Patient Active Problem List   Diagnosis Date Noted  . Renal cyst, native, hemorrhage 01/08/2018  . Palpitations 12/05/2017  . Dyspnea on exertion 12/05/2017  . Coronary artery calcification seen on CT scan 12/05/2017  . 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 PLANTAR FACIITIS 07/08/2008    Past Surgical History:  Procedure Laterality Date  . CATARACT EXTRACTION    . FOOT SURGERY    . KIDNEY STONE SURGERY    . LASIK    . PALATE / UVULA BIOPSY / EXCISION      OB History  No obstetric history on file.    Family History  Problem Relation Age of Onset  . Heart disease Other   . Arthritis  Other   . Lung disease Other   . Cancer Other    Social History   Tobacco Use  . Smoking status: Never Smoker  . Smokeless tobacco: Never Used  Substance Use Topics  . Alcohol use: No    Comment: occasionally  . Drug use: No   Review of Systems Review of Systems  Constitutional: Negative for chills and fever.  HENT: Negative for ear pain and sore throat.   Eyes: Negative for pain and visual disturbance.  Respiratory: Negative for cough and shortness of breath.   Cardiovascular: Negative for chest pain and palpitations.  Gastrointestinal: Positive for abdominal pain and nausea. Negative for abdominal distention, blood in stool, constipation, diarrhea and vomiting.  Genitourinary: Negative  for dysuria and hematuria.  Musculoskeletal: Negative for arthralgias and back pain.  Skin: Negative for color change and rash.  Neurological: Negative for seizures and syncope.  All other systems reviewed and are negative.   Physical Exam Updated Vital Signs BP 112/76 (BP Location: Right Arm)   Pulse 99   Temp (!) 96.8 F (36 C) (Temporal)   Resp 15   Ht 5\' 1"  (1.549 m)   Wt 55.3 kg   SpO2 98%   BMI 23.05 kg/m   Physical Exam Vitals signs and nursing note reviewed.  Constitutional:      General: She is not in acute distress.    Appearance: She is well-developed.  HENT:     Head: Normocephalic and atraumatic.  Eyes:     Conjunctiva/sclera: Conjunctivae normal.  Neck:     Musculoskeletal: Neck supple.  Cardiovascular:     Rate and Rhythm: Normal rate and regular rhythm.     Heart sounds: No murmur.  Pulmonary:     Effort: Pulmonary effort is normal. No respiratory distress.     Breath sounds: Normal breath sounds.  Abdominal:     Palpations: Abdomen is soft.     Tenderness: There is abdominal tenderness in the epigastric area, periumbilical area and suprapubic area. There is no guarding or rebound.  Skin:    General: Skin is warm and dry.  Neurological:     Mental Status: She is alert.     ED Treatments / Results  Labs (all labs ordered are listed, but only abnormal results are displayed) Labs Reviewed  COMPREHENSIVE METABOLIC PANEL - Abnormal; Notable for the following components:      Result Value   Chloride 95 (*)    BUN 7 (*)    All other components within normal limits  URINALYSIS, ROUTINE W REFLEX MICROSCOPIC - Abnormal; Notable for the following components:   APPearance HAZY (*)    Ketones, ur 20 (*)    Protein, ur 100 (*)    Bacteria, UA RARE (*)    All other components within normal limits  LIPASE, BLOOD  CBC    EKG None  Radiology No results found.  Procedures Procedures (including critical care time)  Medications Ordered in ED  Medications - No data to display  Initial Impression / Assessment and Plan / ED Course  I have reviewed the triage vital signs and the nursing notes. Pertinent labs & imaging results that were available during my care of the patient were reviewed by me and considered in my medical decision making (see chart for details). Patient 63 year old otherwise healthy female who is presenting with acute onset abdominal pain x2 days.  Patient reports that the pain is constant and was originally on her left side but is now gone  to midline in the epigastric and suprapubic region.  Patient denies any recent GI illness or history of GI illness, bloody diarrhea, constipation.  She does report report taking antibiotics for UTI 2 weeks ago.  She also has history of left-sided renal cyst. She denies NSAID use.  On physical exam, patient has active bowel sounds with tenderness to palpation of midline.  She does not have any guarding or rebound.  Her UA is significant for some protein, ketones and rare bacteria with hyaline casts.  Her CMP is normal, lipase is normal at 21.  CBC is also within normal limits.  Given negative history and otherwise normal labs, will obtain CT abdomen to rule out mass, diverticulitis, and other structural causes of pain. Clinical Course as of Jan 17 1608  Thu Jan 17, 2019  1302 CT ABDOMEN PELVIS W CONTRAST [RK]  1355 CT ABDOMEN PELVIS W CONTRAST [RK]    Clinical Course User Index [RK] Wilber Oliphant, MD   Dr. Reather Converse and I have discussed this case with Dr. Rogene Houston and will transfer care.    Final Clinical Impressions(s) / ED Diagnoses    Wilber Oliphant, M.D. FM PGY-2      Wilber Oliphant, MD 01/17/19 1611    Fredia Sorrow, MD 01/17/19 [phone]

## 2019-01-17 NOTE — ED Triage Notes (Signed)
Pt having abdominal pain that started Tuesday night. Went to PCP yesterday and got blood work drawn and a urinalysis with nothing abnormal found.

## 2019-01-17 NOTE — ED Triage Notes (Signed)
Patient with abdominal and chest pain.  She is having right arm pain, having a hard time to pick it up.  She states she had numbness in her hands.  She did go to [facility] today.  She was having tightness in her chest that was getting bad this evening.

## 2019-01-17 NOTE — Discharge Instructions (Signed)
CT scan and work-up here today without any acute findings.  Make an appointment to follow-up with your regular doctor.  Take tramadol as needed for pain.

## 2019-01-18 LAB — CBC
HCT: 39.3 % (ref 36.0–46.0)
Hemoglobin: 12.8 g/dL (ref 12.0–15.0)
MCH: 29.3 pg (ref 26.0–34.0)
MCHC: 32.6 g/dL (ref 30.0–36.0)
MCV: 89.9 fL (ref 80.0–100.0)
Platelets: 231 10*3/uL (ref 150–400)
RBC: 4.37 MIL/uL (ref 3.87–5.11)
RDW: 12.5 % (ref 11.5–15.5)
WBC: 5.5 10*3/uL (ref 4.0–10.5)
nRBC: 0 % (ref 0.0–0.2)

## 2019-01-18 LAB — BASIC METABOLIC PANEL
Anion gap: 12 (ref 5–15)
BUN: 6 mg/dL — ABNORMAL LOW (ref 8–23)
CO2: 27 mmol/L (ref 22–32)
Calcium: 9.3 mg/dL (ref 8.9–10.3)
Chloride: 98 mmol/L (ref 98–111)
Creatinine, Ser: 0.69 mg/dL (ref 0.44–1.00)
GFR calc Af Amer: >60 mL/min (ref >60)
GFR calc non Af Amer: >60 mL/min (ref >60)
Glucose, Bld: 92 mg/dL (ref 70–99)
Potassium: 3.5 mmol/L (ref 3.5–5.1)
Sodium: 137 mmol/L (ref 135–145)

## 2019-01-18 LAB — TROPONIN I (HIGH SENSITIVITY)
Troponin I (High Sensitivity): 4 ng/L (ref <18)
Troponin I (High Sensitivity): 6 ng/L (ref <18)

## 2019-01-18 NOTE — ED Provider Notes (Signed)
[facility] Emergency Department Provider Note MRN:  [id number]  Arrival date & time: 01/18/19     Chief Complaint   Chest Pain   History of Present Illness   Rebecca Fernandez is a 62 y.o. year-old female with no pertinent past medical history presenting to the ED with chief complaint of chest pain.  Patient began feeling unwell 3 days ago.  Began with nausea.  No vomiting.  Then began experiencing abdominal discomfort.  Was seen at [facility] last night with a negative work-up, discharge.  On the way home she explains that she began experiencing chest discomfort.  Described as sharp, diffuse across the chest.  Denies cough, no fever, no shortness of breath, no dysuria, no rash, no leg pain.  Noted some paresthesia to the right arm a few days ago.  Review of Systems  A complete 10 system review of systems was obtained and all systems are negative except as noted in the HPI and PMH.   Patient's Health History    Past Medical History:  Diagnosis Date  . Kidney stones     Past Surgical History:  Procedure Laterality Date  . CATARACT EXTRACTION    . FOOT SURGERY    . KIDNEY STONE SURGERY    . LASIK    . PALATE / UVULA BIOPSY / EXCISION      Family History  Problem Relation Age of Onset  . Heart disease Other   . Arthritis Other   . Lung disease Other   . Cancer Other     Social History   Socioeconomic History  . Marital status: Married    Spouse name: Not on file  . Number of children: Not on file  . Years of education: Not on file  . Highest education level: Not on file  Occupational History  . Not on file  Social Needs  . Financial resource strain: Not on file  . Food insecurity    Worry: Not on file    Inability: Not on file  . Transportation needs    Medical: Not on file    Non-medical: Not on file  Tobacco Use  . Smoking status: Never Smoker  . Smokeless tobacco: Never Used  Substance and Sexual Activity  . Alcohol use: No    Comment: occasionally  . Drug use: No  . Sexual activity: Not on file  Lifestyle  . Physical activity    Days per week: Not on file    Minutes per session: Not on file  . Stress: Not on file  Relationships  . Social Herbalist on phone: Not on file    Gets together: Not on file    Attends religious service: Not on file    Active member of club or organization: Not on file    Attends meetings of clubs or organizations: Not on file    Relationship status: Not on file  . Intimate partner violence    Fear of current or ex partner: Not on file    Emotionally abused: Not on file    Physically abused: Not on file    Forced sexual activity: Not on file  Other Topics Concern  . Not on file  Social History Narrative  . Not on file     Physical Exam  Vital Signs and Nursing Notes reviewed Vitals:   01/17/19 2332  BP: (!) 105/40  Pulse: 98  Resp: 18  Temp: 98 F (36.7 C)  SpO2:  100%    CONSTITUTIONAL: Well-appearing, NAD NEURO:  Alert and oriented x 3, no focal deficits EYES:  eyes equal and reactive ENT/NECK:  no LAD, no JVD CARDIO: Regular rate, well-perfused, normal S1 and S2 PULM:  CTAB no wheezing or rhonchi GI/GU:  normal bowel sounds, non-distended, non-tender MSK/SPINE:  No gross deformities, no edema SKIN:  no rash, atraumatic PSYCH:  Appropriate speech and behavior  Diagnostic and Interventional Summary    EKG Interpretation  Date/Time:  Thursday January 17 2019 23:37:28 EDT Ventricular Rate:  104 PR Interval:  148 QRS Duration: 102 QT Interval:  360 QTC Calculation: 473 R Axis:   89 Text Interpretation:  Sinus tachycardia Right atrial enlargement Anterolateral infarct , age undetermined Abnormal ECG Confirmed by Gerlene Fee [phone]) on 01/18/2019 8:09:10 AM      Labs Reviewed  BASIC METABOLIC PANEL - Abnormal; Notable for the following components:      Result Value   BUN 6 (*)    All other components within normal limits  CBC  TROPONIN  I (HIGH SENSITIVITY)  TROPONIN I (HIGH SENSITIVITY)    DG Chest 2 View  Final Result      Medications  sodium chloride flush (NS) 0.9 % injection 3 mL (has no administration in time range)     Procedures Critical Care  ED Course and Medical Decision Making  I have reviewed the triage vital signs and the nursing notes.  Pertinent labs & imaging results that were available during my care of the patient were reviewed by me and considered in my medical decision making (see below for details).  Reassuring labs and CT abdomen last night, here with chest pain that is atypical.  EKG is reassuring, vital signs are normal, no evidence of DVT, little to no concern for pulmonary embolism.  Troponin negative x2.  Neurological exam is completely normal, still given the right arm symptoms/paresthesias CT head offered but patient declines this testing at this time.  She has been in the waiting room for 9 hours and wants to go home.  This is an appropriate plan and she can follow-up with her regular doctor for any further testing.  No signs of emergent condition today.  Barth Kirks. Sedonia Small, [place] [email or web address]  Final Clinical Impressions(s) / ED Diagnoses     ICD-10-CM   1. Chest pain, unspecified type  R07.9     ED Discharge Orders    None      Discharge Instructions Discussed with and Provided to Patient:   Discharge Instructions     You were evaluated in the Emergency Department and after careful evaluation, we did not find any emergent condition requiring admission or further testing in the hospital.  Your exam/testing today was overall reassuring.  Please return to the Emergency Department if you experience any worsening of your condition.  We encourage you to follow up with a primary care provider.  Thank you for allowing [place] to be a part of your care.       Maudie Flakes, MD 01/18/19 [phone]

## 2019-01-18 NOTE — ED Notes (Signed)
Patient verbalizes understanding of discharge instructions. Opportunity for questioning and answers were provided. Armband removed by staff, pt discharged from ED home via POV.  

## 2019-01-18 NOTE — Discharge Instructions (Addendum)
You were evaluated in the Emergency Department and after careful evaluation, we did not find any emergent condition requiring admission or further testing in the hospital. ° °Your exam/testing today was overall reassuring. ° °Please return to the Emergency Department if you experience any worsening of your condition.  We encourage you to follow up with a primary care provider.  Thank you for allowing us to be a part of your care. ° °

## 2019-01-29 DIAGNOSIS — K582 Mixed irritable bowel syndrome: Secondary | ICD-10-CM | POA: Diagnosis not present

## 2019-01-29 DIAGNOSIS — F439 Reaction to severe stress, unspecified: Secondary | ICD-10-CM | POA: Diagnosis not present

## 2019-01-29 DIAGNOSIS — R1084 Generalized abdominal pain: Secondary | ICD-10-CM | POA: Diagnosis not present

## 2019-01-29 DIAGNOSIS — K219 Gastro-esophageal reflux disease without esophagitis: Secondary | ICD-10-CM | POA: Diagnosis not present

## 2019-01-29 DIAGNOSIS — F419 Anxiety disorder, unspecified: Secondary | ICD-10-CM | POA: Diagnosis not present

## 2019-02-04 ENCOUNTER — Telehealth: Payer: Self-pay | Admitting: Internal Medicine

## 2019-02-15 NOTE — Telephone Encounter (Signed)
GI ATTENDING  Records request for review.  Patient had a colonoscopy with Dr. Collene Mares Aug 15, 2018.  The examination was complete.  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  The exam was otherwise normal.  Diagnosed with chronic idiopathic constipation.  Okay to reestablish with [facility].

## 2019-02-18 ENCOUNTER — Telehealth: Payer: Self-pay

## 2019-02-18 NOTE — Telephone Encounter (Signed)
Looks like you can go ahead and schedule her with an APP.  Thanks!

## 2019-02-18 NOTE — Telephone Encounter (Signed)
Patient is schedule with Colleen on 12/15.

## 2019-02-18 NOTE — Telephone Encounter (Signed)
Patient had you review her colonoscopy with Dr. Collene Mares in 12/2018 which you reviewed and told me to file in epic.  Patient now calling wanting to be seen because of the nausea and abdominal pain for which she saw Dr. Deatra Ina at [facility].  She mentions a "mass or cyst" that is supposed to be discussed.  I found a note in one of Dr. 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  Please advise.  Thanks

## 2019-02-18 NOTE — Telephone Encounter (Signed)
APP appointment

## 2019-02-19 DIAGNOSIS — R102 Pelvic and perineal pain: Secondary | ICD-10-CM | POA: Diagnosis not present

## 2019-02-20 IMAGING — PT NM PET TUM IMG INITIAL (PI) SKULL BASE T - THIGH
8 series · 25 of 25 positions shown · non-contrast
Comparison: 09/29/2017 chest CT.  Abdominopelvic CT of 09/12/2010.

CLINICAL DATA: Initial treatment strategy for staging of right
lower lobe pulmonary nodule..

EXAM:
NUCLEAR MEDICINE PET SKULL BASE TO THIGH
TECHNIQUE: 5.5 mCi F-18 FDG was injected intravenously. Full-ring PET imaging
was performed from the skull base to thigh after the radiotracer. CT
data was obtained and used for attenuation correction and anatomic
localization.
Fasting blood glucose: 86 mg/dl

[Series 2: ct sk_thigh 5.0 b31f · axial · 5.0mm · 0.98mm/px · z∈[-1238,-426]mm · 3 of 204 slices shown]
[im 1/204]
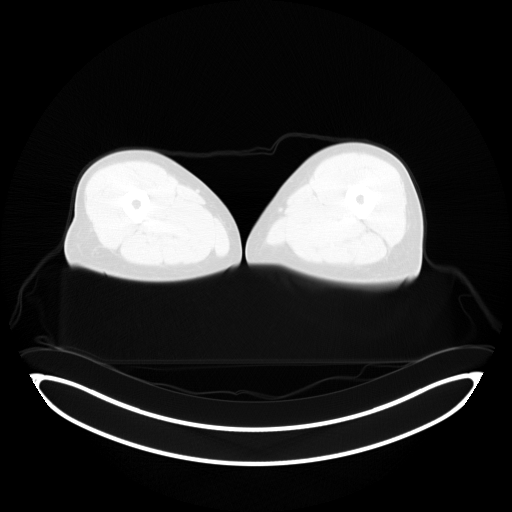
[im 102/204]
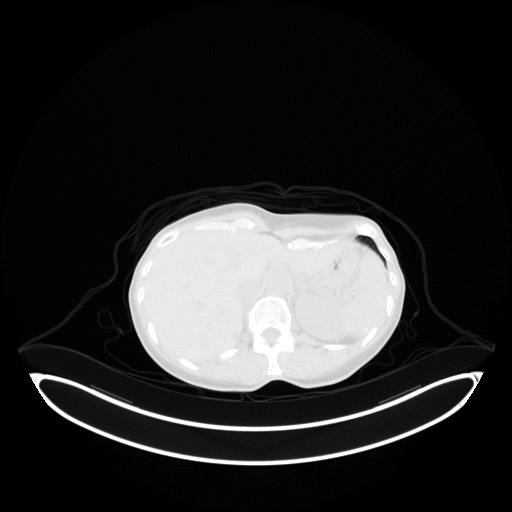
[im 204/204  brain]
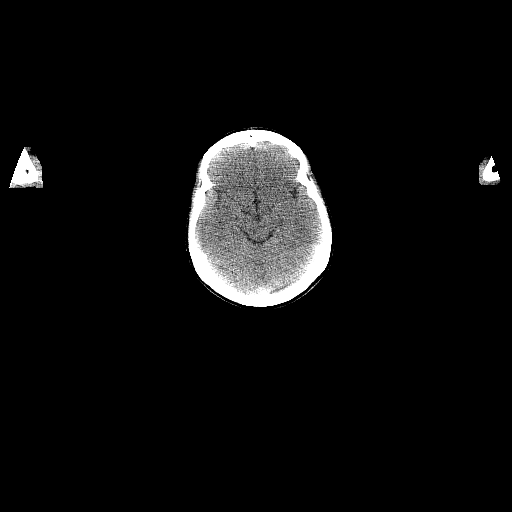

[Series 6: pet sk_thigh ac · axial · 2.0mm · 4.07mm/px · z∈[-1237,-427]mm · 9 of 401 slices shown]
[im 1/401]
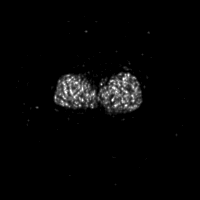
[im 51/401]
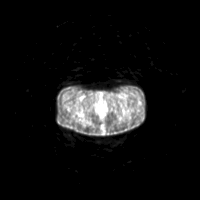
[im 101/401]
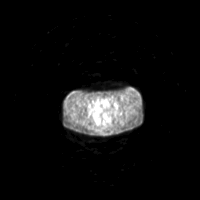
[im 151/401]
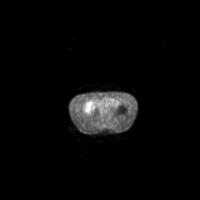
[im 201/401]
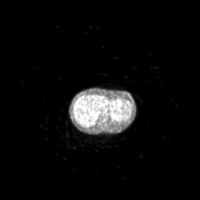
[im 251/401]
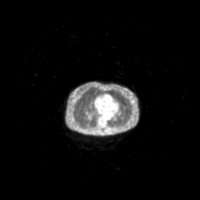
[im 301/401]
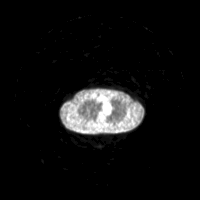
[im 351/401]
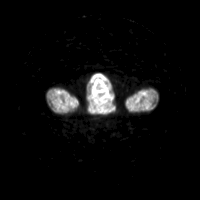
[im 401/401]
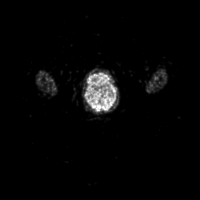

[Series 7: pet sk_thigh nac · axial · 5.0mm · 4.07mm/px · z∈[-1238,-426]mm · 4 of 204 slices shown]
[im 1/204]
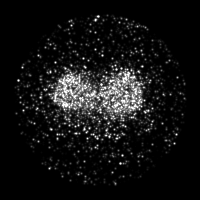
[im 68/204]
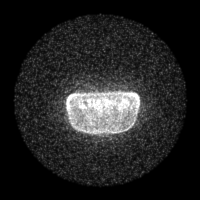
[im 136/204]
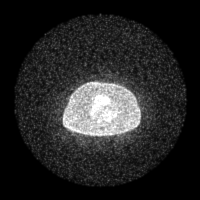
[im 204/204]
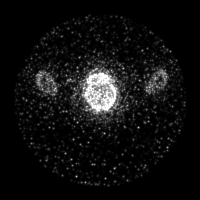

[Series 8: ct sk_thigh 5.0 (id) lung_bone · axial · 5.0mm · 0.59mm/px · 1 of 68 slices shown]
[im 1/68  bone]
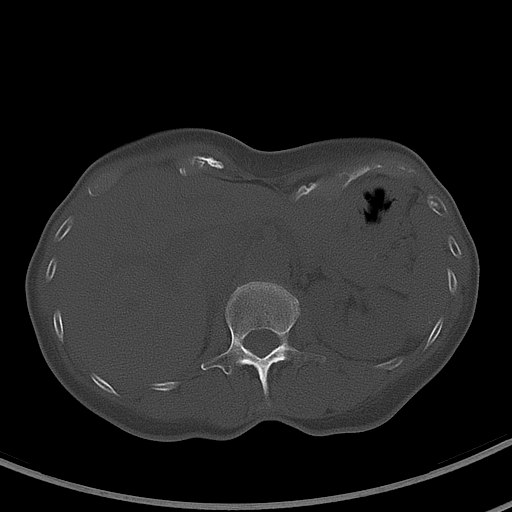

[Series 603: range-ct sk_thigh 5.0 (id)<alpha range> · 2 of 76 slices shown (1 of 2)]
[im 1/76]
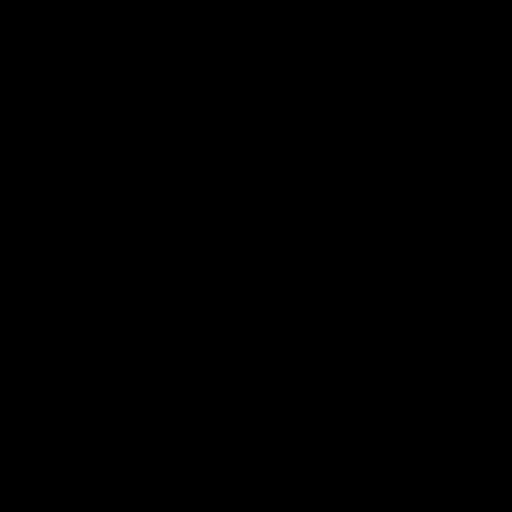
[im 76/76]
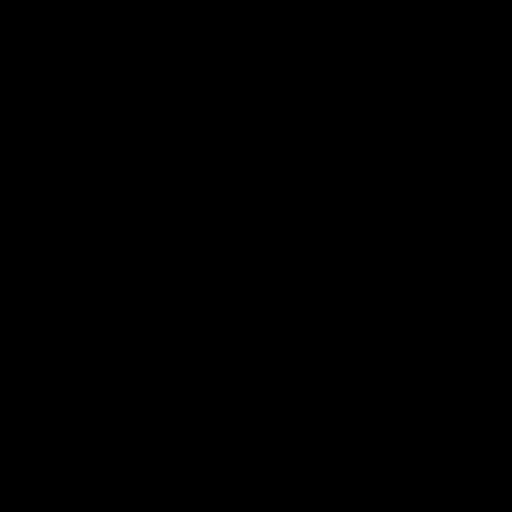

[Series 604: mip range 3 · coronal · 1.68mm/px · 1 of 32 slices shown]
[im 1/32]
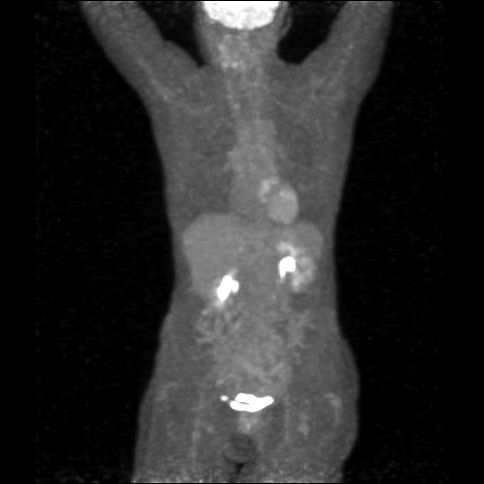

[Series 605: range-ct sk_thigh 5.0 (id)<alpha range> · 4 of 190 slices shown (2 of 2)]
[im 1/190]
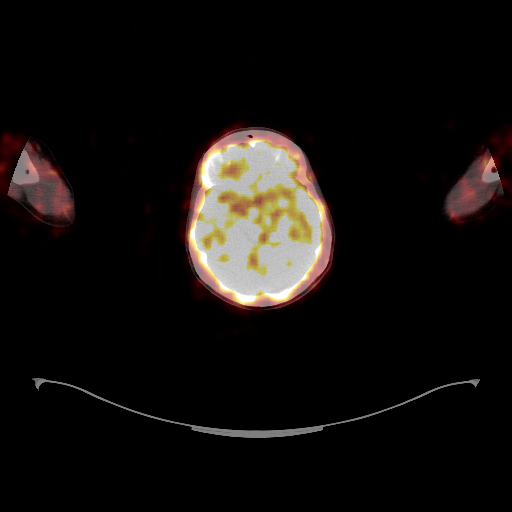
[im 64/190]
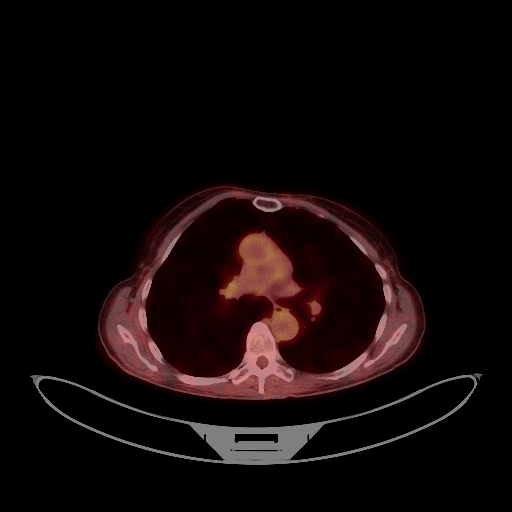
[im 127/190]
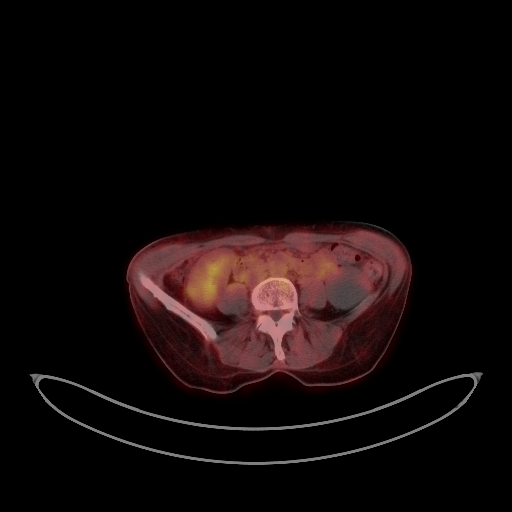
[im 190/190]
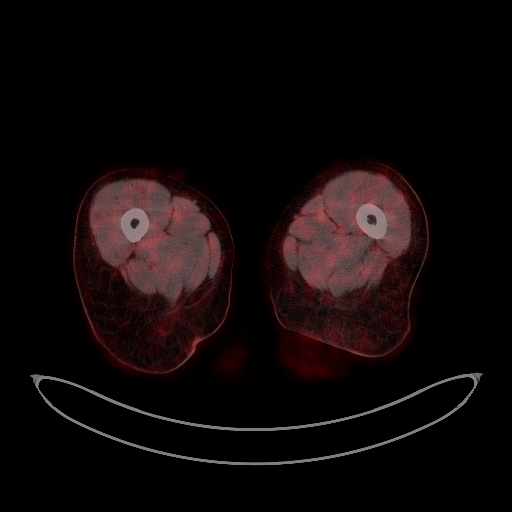

[Series 1056: results mm oncology reading · 3.0mm · 0.84mm/px · 1 of 1 slices shown]
[im 1/1]
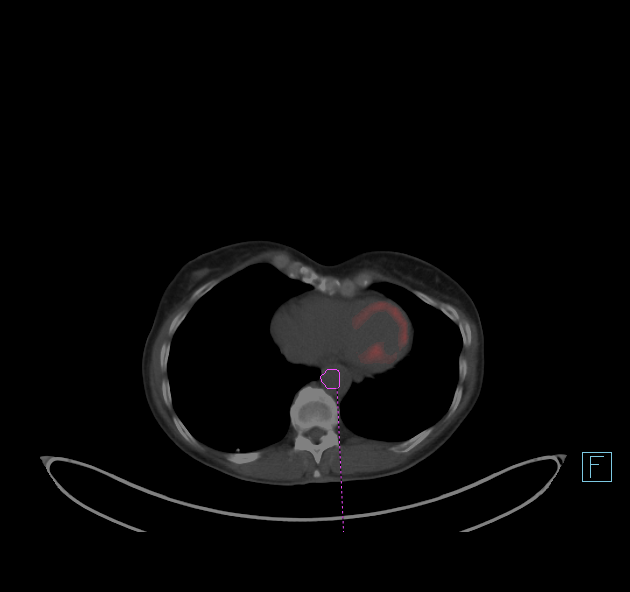

[25 of 25 positions shown; findings below may reference images not displayed]

FINDINGS: Mediastinal blood pool activity: SUV max

NECK: No areas of abnormal hypermetabolism.

Incidental CT findings: No cervical adenopathy.

CHEST: No thoracic nodal hypermetabolism. The previously described 9
x 7 mm pulmonary nodule is no longer identified. No pulmonary
parenchymal hypermetabolism identified.

Incidental CT findings: Mild biapical pleuroparenchymal scarring.
Heart size accentuated by a pectus excavatum deformity. Multivessel
coronary artery atherosclerosis. Calcified granulomas in the right
lower lobe.

ABDOMEN/PELVIS: No abdominopelvic parenchymal or nodal
hypermetabolism.

Incidental CT findings: Old granulomatous disease in the spleen.
Hepatic low-density lesions are well-circumscribed and likely cysts.
Normal adrenal glands. Punctate bilateral renal collecting system
calculi. A lower pole left renal exophytic 4.2 cm lesion measures 50
HU, and is at the site of a lower density similar size lesion on
09/02/2010. Abdominal aortic atherosclerosis. Hysterectomy.

SKELETON: No abnormal marrow activity.

Incidental CT findings: No acute osseous abnormality.
IMPRESSION: 1. The right lower lobe pulmonary nodule has resolved, and was
presumably infectious or inflammatory.
2. Age advanced coronary artery atherosclerosis. Recommend
assessment of coronary risk factors and consideration of medical
therapy.
3.  Aortic Atherosclerosis (ICMYU-708.8).
4. Lower pole left renal lesion is most consistent with a complex
cyst, given lower density lesion of a similar size back on
09/12/2010.
5. Bilateral nephrolithiasis.

## 2019-02-27 NOTE — Telephone Encounter (Signed)
Pt scheduled on 03/12/2019

## 2019-03-12 ENCOUNTER — Ambulatory Visit: Payer: PPO | Admitting: Nurse Practitioner

## 2019-03-12 ENCOUNTER — Telehealth: Payer: Self-pay | Admitting: Nurse Practitioner

## 2019-03-12 ENCOUNTER — Encounter: Payer: Self-pay | Admitting: Nurse Practitioner

## 2019-03-12 VITALS — BP 110/68 | HR 66 | Temp 97.8°F | Ht 61.0 in | Wt 129.4 lb

## 2019-03-12 DIAGNOSIS — K219 Gastro-esophageal reflux disease without esophagitis: Secondary | ICD-10-CM

## 2019-03-12 DIAGNOSIS — R1013 Epigastric pain: Secondary | ICD-10-CM

## 2019-03-12 DIAGNOSIS — R110 Nausea: Secondary | ICD-10-CM

## 2019-03-12 DIAGNOSIS — Z1159 Encounter for screening for other viral diseases: Secondary | ICD-10-CM

## 2019-03-12 DIAGNOSIS — Z1211 Encounter for screening for malignant neoplasm of colon: Secondary | ICD-10-CM | POA: Diagnosis not present

## 2019-03-12 MED ORDER — ONDANSETRON HCL 8 MG PO TABS: 8.0000 mg | ORAL_TABLET | Freq: Three times a day (TID) | ORAL | 0 refills | Status: DC | PRN

## 2019-03-12 NOTE — Telephone Encounter (Signed)
Spoke with patient and reviewed medication. Moved EGD due to patient on Phentermine.

## 2019-03-12 NOTE — Telephone Encounter (Signed)
Pt informed that she is currently taking: pantoprazole 40 mg po d, OTC Acid-pep 20 mg, dicyclomine 10 mg TID PRN.

## 2019-03-12 NOTE — Progress Notes (Signed)
03/13/2019 [name] [id number] Aug 18, 1955   HISTORY OF PRESENT ILLNESS: Rebecca Fernandez is a 63 year old female with a past medical history of kidney stones and UTIs. S/P uvulaectomy surgery secondary to sleep apnea. Kidney stone surgery 1980. Partial hysterectomy 15 years ago. Right foot plantar fasciitis. She developed LLQ abdominal pain 01/15/2019. Her abdominal pain then progressed and radiated up to her upper abdomen with nausea. No vomiting. She presented to [facility] ER on 01/17/2019 for further evaluation. She also noted a metallic taste in her mouth and her right arm felt heavy, weak with tingling. She was prescribed an antibiotic for a UTI 2 weeks prior.  An abd/pelvic CT in the ED did not show any acute infectious or inflammatory process. A non-obstructing left kidney stone and left kidney cyst were noted. CBC and CMP were normal.  She was discharged home. She developed chest tightness later the same day and she presented to [facility] ER. Troponin x 2 and EKG were normal. She was discharged home. She presents today to re-establish her GI care with Dr. Henrene Pastor. She continues to have nausea, no bloat. Intermittent epigastric pain. Abdominal bloat. No further chest pain. Occasional heartburn. She is taking Pantoprazole 12m QD x 1 year and Famotidine 277mQD was recently started.  She infrequently takes Advil 20036mwo tabs for aches and pains. She is passing a small amount of solid stool each time she urinates. No rectal bleeding or melena. Her most recent colonoscopy was completed by Dr. 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 Mother had sinus and liver cancer. Father had lung cancer. No family history of UGI or colorectal cancer. She drinks 1 beer every few weeks. She drinks 4 cups of coffee daily.   Labs 01/17/2019: WBC 5.5. Hg 12.8. HCT 39.3. PLT 231. NA 137. K 3.5. BUN 6. Cr. 0.69. T. Bili 0.7. Alk phos 55. AST 27. ALT 25.  An abdominal/pelvic CT  01/17/2019: 1. No evidence for acute abnormality of the abdomen or pelvis. 2. Hepatic and renal cysts. 3. Small LOWER pole LEFT renal cyst, no longer associated with hemorrhage. 4. Nonobstructing LEFT intrarenal calculus. 5. Normal appendix. 6. Moderate stool burden. 7. Status post hysterectomy. 8. Aortic Atherosclerosis  Spleen: Numerous calcified granulomata throughout the spleen consistent with prior granulomatous disease. No hx of TB or sarcoidosis.   Colonoscopy by Dr. 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  Colonoscopy 06/09/2005 by Dr. PerHenrene Pastorormal. 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  Past Medical History:  Diagnosis Date  . Kidney stones    Past Surgical History:  Procedure Laterality Date  . CATARACT EXTRACTION    . FOOT SURGERY    . KIDNEY STONE SURGERY    . LASIK    . PALATE / UVULA BIOPSY / EXCISION       Outpatient Encounter Medications as of 03/12/2019  Medication Sig  . buPROPion (WELLBUTRIN XL) 300 MG 24 hr tablet Take 1 tablet by mouth daily.  . dMarland Kitchencyclomine (BENTYL) 10 MG capsule Take 10 mg by mouth 3 (three) times daily as needed.  . eMarland Kitchentradiol (ESTRACE) 0.5 MG tablet Take 0.5 mg by mouth daily.   . famotidine (PEPCID) 20 MG tablet Take 20 mg by mouth.  . pantoprazole (PROTONIX) 40 MG tablet Take 1 tablet by mouth daily.  . phentermine (ADIPEX-P) 37.5 MG tablet Take 37.5 mg by mouth every morning.  . VMarland KitchenTAMIN A PO Take 1 tablet by mouth  2 (two) times daily.  Marland Kitchen XIIDRA 5 % SOLN Place 1 drop into both eyes 2 (two) times daily.  Marland Kitchen zolpidem (AMBIEN) 10 MG tablet Take 10 mg by mouth at bedtime as needed.   . [DISCONTINUED] ondansetron (ZOFRAN) 4 MG tablet Take 1 tablet (4 mg total) by mouth every 8 (eight) hours as needed for nausea or vomiting.  . ondansetron (ZOFRAN) 8 MG tablet Take 1 tablet (8 mg total) by mouth every 8 (eight) hours as needed for nausea or vomiting.  . [DISCONTINUED] acetaminophen (TYLENOL) 500 MG  tablet Take 2 tablets (1,000 mg total) by mouth every 6 (six) hours as needed for mild pain.  . [DISCONTINUED] oxyCODONE (OXY IR/ROXICODONE) 5 MG immediate release tablet Take 1-2 tablets (5-10 mg total) by mouth every 4 (four) hours as needed for severe pain.  . [DISCONTINUED] traMADol (ULTRAM) 50 MG tablet Take 1 tablet (50 mg total) by mouth every 6 (six) hours as needed.   Facility-Administered Encounter Medications as of 03/12/2019  Medication  . technetium pyrophosphate Tc 42minjection 8.8 millicurie   Family History  Problem Relation Age of Onset  . Heart disease Other   . Arthritis Other   . Lung disease Other   . Cancer Other    Social History   Socioeconomic History  . Marital status: Married    Spouse name: Not on file  . Number of children: Not on file  . Years of education: Not on file  . Highest education level: Not on file  Occupational History  . Not on file  Tobacco Use  . Smoking status: Never Smoker  . Smokeless tobacco: Never Used  Substance and Sexual Activity  . Alcohol use: No    Comment: occasionally  . Drug use: No  . Sexual activity: Not on file  Other Topics Concern  . Not on file  Social History Narrative  . Not on file   Social Determinants of Health   Financial Resource Strain:   . Difficulty of Paying Living Expenses: Not on file  Food Insecurity:   . Worried About RCharity fundraiserin the Last Year: Not on file  . Ran Out of Food in the Last Year: Not on file  Transportation Needs:   . Lack of Transportation (Medical): Not on file  . Lack of Transportation (Non-Medical): Not on file  Physical Activity:   . Days of Exercise per Week: Not on file  . Minutes of Exercise per Session: Not on file  Stress:   . Feeling of Stress : Not on file  Social Connections:   . Frequency of Communication with Friends and Family: Not on file  . Frequency of Social Gatherings with Friends and Family: Not on file  . Attends Religious Services: Not  on file  . Active Member of Clubs or Organizations: Not on file  . Attends CArchivistMeetings: Not on file  . Marital Status: Not on file  Intimate Partner Violence:   . Fear of Current or Ex-Partner: Not on file  . Emotionally Abused: Not on file  . Physically Abused: Not on file  . Sexually Abused: Not on file   REVIEW OF SYSTEMS  : All other systems reviewed and negative except where noted in the History of Present Illness.   PHYSICAL EXAM: BP 110/68 (BP Location: Left Arm, Patient Position: Sitting, Cuff Size: Normal)   Pulse 66   Temp 97.8 F (36.6 C) (Oral)   Ht _0  (1.549 m)  Wt 129 lb 6 oz (58.7 kg)   BMI 24.45 kg/m  General: Well developed 63 year old female in no acute distress Head: Normocephalic and atraumatic Eyes:  Sclerae anicteric, conjunctive pink. Ears: Normal auditory acuity Neck: Supple, no masses.  Lungs: Clear throughout to auscultation Heart: Regular rate and rhythm, no murmur Abdomen: Soft, epigastric tenderness, non distended. No masses or hepatomegaly noted. Normal bowel sounds x 4 quads. Rectal: Deferred Musculoskeletal: Symmetrical with no gross deformities  Skin: No lesions on visible extremities Extremities: No edema  Neurological: Alert oriented x 4, grossly nonfocal Cervical Nodes:  No significant cervical adenopathy Psychological:  Alert and cooperative. Normal mood and affect  ASSESSMENT AND PLAN:  58. 63 year old female with nausea and epigastric pain -EGD benefits and risks discussed including risk with sedation, risk of bleeding, perforation and infection -Continue Pantoprazole and Famotidine as previously ordered. Ok to use  Dicyclomine PRN.  -Reduce coffee intake -Abdominal sonogram to assess the gallbladder  -Further recommendations to be determined after EGD completed   2. Nausea -Ondansetron 98m tab 1 po Q 8 hrs PRN -Patient to call office if her symptoms worsen  3. Colon cancer screening, up to date    CC:   KAletha Halim, PA-C

## 2019-03-12 NOTE — Patient Instructions (Addendum)
If you are age 65 or older, your body mass index should be between 23-30. Your Body mass index is 24.45 kg/m. If this is out of the aforementioned range listed, please consider follow up with your Primary Care Provider.  If you are age 61 or younger, your body mass index should be between 19-25. Your Body mass index is 24.45 kg/m. If this is out of the aformentioned range listed, please consider follow up with your Primary Care Provider.   You have been scheduled for an abdominal ultrasound at [facility] (1st floor of hospital) on Monday 03/18/19 at 10:30 am. Please arrive 15 minutes prior to your appointment for registration. Make certain not to have anything to eat or drink 6 hours prior to your appointment. Should you need to reschedule your appointment, please contact radiology at [phone]. This test typically takes about 30 minutes to perform.  You have been scheduled for an endoscopy. Please follow written instructions given to you at your visit today. If you use inhalers (even only as needed), please bring them with you on the day of your procedure.  Please call with name of reflux medication.   Ondansetron 8 mg tablet every 8 hours as needed.   Call office if symptoms get worse.

## 2019-03-13 ENCOUNTER — Telehealth: Payer: Self-pay | Admitting: Nurse Practitioner

## 2019-03-13 DIAGNOSIS — R1013 Epigastric pain: Secondary | ICD-10-CM | POA: Insufficient documentation

## 2019-03-13 DIAGNOSIS — R110 Nausea: Secondary | ICD-10-CM

## 2019-03-13 NOTE — Telephone Encounter (Signed)
Patient called and states the Zofran-8mg  has not helped at all, that she is still nauseated all the time. No emesis, but feels like she is going to. States she also has a metallic taste in her mouth. I told her it may have not had enough time to work. She just started on it yesterday. She wants to know if she can try something else. Please advise

## 2019-03-13 NOTE — Progress Notes (Signed)
Assessment and plan reviewed 

## 2019-03-13 NOTE — Telephone Encounter (Signed)
Pls send RX for promethazine 12.5mg  one po bid PRN for nausea,  # 20, no refills inform pt may cause drowsiness. THX

## 2019-03-14 ENCOUNTER — Encounter: Payer: Self-pay | Admitting: Internal Medicine

## 2019-03-14 ENCOUNTER — Other Ambulatory Visit (INDEPENDENT_AMBULATORY_CARE_PROVIDER_SITE_OTHER): Payer: PPO

## 2019-03-14 DIAGNOSIS — R1013 Epigastric pain: Secondary | ICD-10-CM | POA: Diagnosis not present

## 2019-03-14 DIAGNOSIS — R110 Nausea: Secondary | ICD-10-CM

## 2019-03-14 LAB — COMPREHENSIVE METABOLIC PANEL
ALT: 19 U/L (ref 0–35)
AST: 18 U/L (ref 0–37)
Albumin: 3.9 g/dL (ref 3.5–5.2)
Alkaline Phosphatase: 49 U/L (ref 39–117)
BUN: 7 mg/dL (ref 6–23)
CO2: 29 mEq/L (ref 19–32)
Calcium: 8.8 mg/dL (ref 8.4–10.5)
Chloride: 98 mEq/L (ref 96–112)
Creatinine, Ser: 0.66 mg/dL (ref 0.40–1.20)
GFR: 89.87 mL/min (ref >60.00)
Glucose, Bld: 85 mg/dL (ref 70–99)
Potassium: 3.5 mEq/L (ref 3.5–5.1)
Sodium: 134 mEq/L — ABNORMAL LOW (ref 135–145)
Total Bilirubin: 0.5 mg/dL (ref 0.2–1.2)
Total Protein: 5.8 g/dL — ABNORMAL LOW (ref 6.0–8.3)

## 2019-03-14 LAB — CBC WITH DIFFERENTIAL/PLATELET
Basophils Absolute: 0 10*3/uL (ref 0.0–0.1)
Basophils Relative: 0.6 % (ref 0.0–3.0)
Eosinophils Absolute: 0.1 10*3/uL (ref 0.0–0.7)
Eosinophils Relative: 2 % (ref 0.0–5.0)
HCT: 35.5 % — ABNORMAL LOW (ref 36.0–46.0)
Hemoglobin: 11.8 g/dL — ABNORMAL LOW (ref 12.0–15.0)
Lymphocytes Relative: 25 % (ref 12.0–46.0)
Lymphs Abs: 1.2 10*3/uL (ref 0.7–4.0)
MCHC: 33.2 g/dL (ref 30.0–36.0)
MCV: 87.4 fl (ref 78.0–100.0)
Monocytes Absolute: 0.5 10*3/uL (ref 0.1–1.0)
Monocytes Relative: 9.7 % (ref 3.0–12.0)
Neutro Abs: 3 10*3/uL (ref 1.4–7.7)
Neutrophils Relative %: 62.7 % (ref 43.0–77.0)
Platelets: 194 10*3/uL (ref 150.0–400.0)
RBC: 4.07 Mil/uL (ref 3.87–5.11)
RDW: 13.7 % (ref 11.5–15.5)
WBC: 4.7 10*3/uL (ref 4.0–10.5)

## 2019-03-14 LAB — LIPASE: Lipase: 10 U/L — ABNORMAL LOW (ref 11.0–59.0)

## 2019-03-14 LAB — C-REACTIVE PROTEIN: CRP: <1 mg/dL (ref 0.5–20.0)

## 2019-03-14 MED ORDER — PROMETHAZINE HCL 12.5 MG PO TABS: 12.5000 mg | ORAL_TABLET | Freq: Two times a day (BID) | ORAL | 0 refills | Status: DC | PRN

## 2019-03-14 MED ORDER — DICYCLOMINE HCL 10 MG PO CAPS: 10.0000 mg | ORAL_CAPSULE | Freq: Three times a day (TID) | ORAL | 0 refills | Status: AC | PRN

## 2019-03-14 NOTE — Addendum Note (Signed)
Addended by: Mohammed Kindle on: 03/14/2019 12:31 PM   Modules accepted: Orders

## 2019-03-14 NOTE — Telephone Encounter (Signed)
Called and spoke with patient-patient informed of MD recommendations; patient is agreeable with plan of care and verified pharmacy; RX sent; Patient verbalized understanding of information/instructions;  Patient was advised to call the office at [phone] if questions/concerns arise;  Patient reports the abd pain has gotten worse and is requesting something for the pain (9/10) Please advise

## 2019-03-14 NOTE — Telephone Encounter (Signed)
Pls call patient and send her to the lab for a CBC, CMP, CRP and lipase level. She can take Dicyclomine 10mg  1 tab po Q 8 hrs PRN, if she doesn't have a supply you can send RX for # 30, no refills. If she has severe abd pain then she should go to the local ER. When you call pt pls verify if she is having upper, lower or generalized abd pain at this time. thx

## 2019-03-14 NOTE — Telephone Encounter (Signed)
Lower abdomen and middle of entire abdomen-patient reports she has blood in her urine 2-3 times yesterday but did not see any as of yet today; Lab orders placed in Epic; RX sent to pharmacy Called and spoke with patient-patient informed of provider's recommendations; patient is agreeable with plan of care;  Patient verbalized understanding of information/instructions;  Patient was advised to call the office at [phone] if questions/concerns arise;

## 2019-03-14 NOTE — Addendum Note (Signed)
Addended by: Mohammed Kindle on: 03/14/2019 09:55 AM   Modules accepted: Orders

## 2019-03-15 ENCOUNTER — Ambulatory Visit (INDEPENDENT_AMBULATORY_CARE_PROVIDER_SITE_OTHER): Payer: PPO

## 2019-03-15 ENCOUNTER — Encounter: Payer: Self-pay | Admitting: Internal Medicine

## 2019-03-15 DIAGNOSIS — Z1159 Encounter for screening for other viral diseases: Secondary | ICD-10-CM | POA: Diagnosis not present

## 2019-03-15 LAB — SARS CORONAVIRUS 2 (TAT 6-24 HRS): SARS Coronavirus 2: NEGATIVE

## 2019-03-17 ENCOUNTER — Telehealth (INDEPENDENT_AMBULATORY_CARE_PROVIDER_SITE_OTHER): Payer: Self-pay | Admitting: Nurse Practitioner

## 2019-03-17 NOTE — Telephone Encounter (Signed)
Bre (staff covering for Bre 12/21). I did not receive your update after you called the patient on 03/13/2019. Your phone note reported patient was now having blood in her urine and her abdominal pain was now mid and lower abdomen. Pls call the patient on Monday 03/18/2019 for further update and provide Dr. Henrene Pastor with her current sx. She needs to follow up with her pcp regarding hematuria.

## 2019-03-18 ENCOUNTER — Ambulatory Visit (HOSPITAL_COMMUNITY)
Admission: RE | Admit: 2019-03-18 | Discharge: 2019-03-18 | Disposition: A | Discharge status: Home / Self Care | Payer: PPO | Source: Ambulatory Visit | Attending: Nurse Practitioner | Admitting: Nurse Practitioner

## 2019-03-18 ENCOUNTER — Other Ambulatory Visit: Payer: Self-pay

## 2019-03-18 DIAGNOSIS — K219 Gastro-esophageal reflux disease without esophagitis: Secondary | ICD-10-CM | POA: Insufficient documentation

## 2019-03-18 DIAGNOSIS — R1013 Epigastric pain: Secondary | ICD-10-CM | POA: Diagnosis not present

## 2019-03-18 DIAGNOSIS — N281 Cyst of kidney, acquired: Secondary | ICD-10-CM | POA: Diagnosis not present

## 2019-03-18 DIAGNOSIS — R110 Nausea: Secondary | ICD-10-CM | POA: Insufficient documentation

## 2019-03-18 NOTE — Telephone Encounter (Signed)
Bre, I don't know why that message was forwarded to you. However, I did send you a message yesterday, phone note as follows: Bre, I did not receive your update after you called the patient on 03/13/2019. Your phone note reported patient was now having blood in her urine and her abdominal pain was now mid and lower abdomen. Pls call the patient on Monday 03/18/2019 for further update and provide Dr. Henrene Pastor with her current sx. She needs to follow up with her pcp regarding hematuria.

## 2019-03-18 NOTE — Telephone Encounter (Signed)
Duplicate message. 

## 2019-03-18 NOTE — Progress Notes (Signed)
I called the patient and left a detailed msg on her voice mail regarding her abd sonogram showed a normal gallbladder. A left kidney cyst was present, stable and should be followed by her PCP. I also advised the patient to contact her PCP regarding her prior phone message which stated she had hematuria. Pt to proceed with EGD as scheduled tomorrow with Dr. Henrene Pastor.

## 2019-03-18 NOTE — Telephone Encounter (Signed)
See abd sono notes

## 2019-03-18 NOTE — Telephone Encounter (Signed)
Called and spoke with patient-patient reports she "feels pretty good right now-nothing hurts right now-but of course I have been laying around"; "when I get up and move -that metal taste in mouth comes back- and I feel sick to my stomach";  Patient is currently getting [place] completed and reports she will come for the procedure tomorrow;  Patient verbalized understanding of information/instructions;   Patient advised to call back to the office at [phone] should questions/concerns arise;

## 2019-03-18 NOTE — Telephone Encounter (Signed)
Is there a reason this was forwarded to me?

## 2019-03-19 ENCOUNTER — Ambulatory Visit (AMBULATORY_SURGERY_CENTER): Payer: PPO | Admitting: Internal Medicine

## 2019-03-19 ENCOUNTER — Encounter: Payer: Self-pay | Admitting: Internal Medicine

## 2019-03-19 VITALS — BP 107/64 | HR 70 | Temp 97.6°F | Resp 13 | Ht 61.0 in | Wt 129.0 lb

## 2019-03-19 DIAGNOSIS — K219 Gastro-esophageal reflux disease without esophagitis: Secondary | ICD-10-CM

## 2019-03-19 DIAGNOSIS — R110 Nausea: Secondary | ICD-10-CM

## 2019-03-19 DIAGNOSIS — K449 Diaphragmatic hernia without obstruction or gangrene: Secondary | ICD-10-CM

## 2019-03-19 DIAGNOSIS — R1013 Epigastric pain: Secondary | ICD-10-CM

## 2019-03-19 MED ORDER — SODIUM CHLORIDE 0.9 % IV SOLN: 500.0000 mL | Freq: Once | INTRAVENOUS | Status: DC

## 2019-03-19 NOTE — Progress Notes (Signed)
Report to PACU, RN, vss, BBS= Clear.  

## 2019-03-19 NOTE — Patient Instructions (Addendum)
Follow-up with you primary care provider regarding urologic issues (hematuria, abnormal imaging), as previously advised.   Handout given on Hiatal hernia  YOU HAD AN ENDOSCOPIC PROCEDURE TODAY AT [facility]:   Refer to the procedure report that was given to you for any specific questions about what was found during the examination.  If the procedure report does not answer your questions, please call your gastroenterologist to clarify.  If you requested that your care partner not be given the details of your procedure findings, then the procedure report has been included in a sealed envelope for you to review at your convenience later.  YOU SHOULD EXPECT: Some feelings of bloating in the abdomen. Passage of more gas than usual.  Walking can help get rid of the air that was put into your GI tract during the procedure and reduce the bloating. If you had a lower endoscopy (such as a colonoscopy or flexible sigmoidoscopy) you may notice spotting of blood in your stool or on the toilet paper. If you underwent a bowel prep for your procedure, you may not have a normal bowel movement for a few days.  Please Note:  You might notice some irritation and congestion in your nose or some drainage.  This is from the oxygen used during your procedure.  There is no need for concern and it should clear up in a day or so.  SYMPTOMS TO REPORT IMMEDIATELY:   Following lower endoscopy (colonoscopy or flexible sigmoidoscopy):  Excessive amounts of blood in the stool  Significant tenderness or worsening of abdominal pains  Swelling of the abdomen that is new, acute  Fever of 100F or higher   Following upper endoscopy (EGD)  Vomiting of blood or coffee ground material  New chest pain or pain under the shoulder blades  Painful or persistently difficult swallowing  New shortness of breath  Fever of 100F or higher  Black, tarry-looking stools  For urgent or emergent issues, a gastroenterologist  can be reached at any hour by calling [phone].   DIET:  We do recommend a small meal at first, but then you may proceed to your regular diet.  Drink plenty of fluids but you should avoid alcoholic beverages for 24 hours.  ACTIVITY:  You should plan to take it easy for the rest of today and you should NOT DRIVE or use heavy machinery until tomorrow (because of the sedation medicines used during the test).    FOLLOW UP: Our staff will call the number listed on your records 48-72 hours following your procedure to check on you and address any questions or concerns that you may have regarding the information given to you following your procedure. If we do not reach you, we will leave a message.  We will attempt to reach you two times.  During this call, we will ask if you have developed any symptoms of COVID 19. If you develop any symptoms (ie: fever, flu-like symptoms, shortness of breath, cough etc.) before then, please call [phone].  If you test positive for Covid 19 in the 2 weeks post procedure, please call and report this information to [place].    If any biopsies were taken you will be contacted by phone or by letter within the next 1-3 weeks.  Please call us at [phone] if you have not heard about the biopsies in 3 weeks.    SIGNATURES/CONFIDENTIALITY: You and/or your care partner have signed paperwork which will be entered into your electronic medical  record.  These signatures attest to the fact that that the information above on your After Visit Summary has been reviewed and is understood.  Full responsibility of the confidentiality of this discharge information lies with you and/or your care-partner.

## 2019-03-19 NOTE — Op Note (Signed)
[facility] Patient Name: Rebecca Fernandez Procedure Date: 03/19/2019 9:45 AM MRN: [id number] Endoscopist: Docia Chuck. Henrene Pastor , MD Age: 63 Referring MD:  Date of Birth: 17-May-1955 Gender: Female Account #: [id number] Procedure:                Upper GI endoscopy Indications:              Abdominal pain, Nausea Medicines:                Monitored Anesthesia Care Procedure:                Pre-Anesthesia Assessment:                           - Prior to the procedure, a History and Physical                            was performed, and patient medications and                            allergies were reviewed. The patient's tolerance of                            previous anesthesia was also reviewed. The risks                            and benefits of the procedure and the sedation                            options and risks were discussed with the patient.                            All questions were answered, and informed consent                            was obtained. Prior Anticoagulants: The patient has                            taken no previous anticoagulant or antiplatelet                            agents. ASA Grade Assessment: II - A patient with                            mild systemic disease. After reviewing the risks                            and benefits, the patient was deemed in                            satisfactory condition to undergo the procedure.                           After obtaining informed consent, the endoscope was  passed under direct vision. Throughout the                            procedure, the patient's blood pressure, pulse, and                            oxygen saturations were monitored continuously. The                            Endoscope was introduced through the mouth, and                            advanced to the second part of duodenum. The upper                            GI endoscopy was accomplished  without difficulty.                            The patient tolerated the procedure well. Scope In: Scope Out: Findings:                 The esophagus was normal.                           The stomach was normal. Small hiatal hernia present.                           The examined duodenum was normal.                           The cardia and gastric fundus were normal on                            retroflexion. Complications:            No immediate complications. Estimated Blood Loss:     Estimated blood loss: none. Impression:               - Normal esophagus.                           - Normal stomach with small hiatal hernia.                           - Normal examined duodenum.                           - No specimens collected. Recommendation:           - Patient has a contact number available for                            emergencies. The signs and symptoms of potential                            delayed complications were discussed with the  patient. Return to normal activities tomorrow.                            Written discharge instructions were provided to the                            patient.                           - Resume previous diet.                           - Continue present medications.                           - Follow-up with your primary care provider                            regarding urologic issues (hematuria, abnormal                            imaging), as previously advised. Docia Chuck. Henrene Pastor, MD 03/19/2019 10:11:18 AM This report has been signed electronically.

## 2019-03-21 ENCOUNTER — Telehealth: Payer: Self-pay | Admitting: *Deleted

## 2019-03-21 NOTE — Telephone Encounter (Signed)
  Follow up Call-  Call back number 03/19/2019  Post procedure Call Back phone  # [phone]  Permission to leave phone message Yes  Some recent data might be hidden     Patient questions:  Do you have a fever, pain , or abdominal swelling? No. Pain Score  0 *  Have you tolerated food without any problems? Yes.    Have you been able to return to your normal activities? Yes.    Do you have any questions about your discharge instructions: Diet   No. Medications  No. Follow up visit  No.  Do you have questions or concerns about your Care? No.  Actions: * If pain score is 4 or above: 1. No action needed, pain <4.Have you developed a fever since your procedure? no  2.   Have you had an respiratory symptoms (SOB or cough) since your procedure? no  3.   Have you tested positive for COVID 19 since your procedure no  4.   Have you had any family members/close contacts diagnosed with the COVID 19 since your procedure?  no   If yes to any of these questions please route to Joylene John, RN and Alphonsa Gin, Therapist, sports.

## 2019-04-02 DIAGNOSIS — F439 Reaction to severe stress, unspecified: Secondary | ICD-10-CM | POA: Diagnosis not present

## 2019-04-02 DIAGNOSIS — R1084 Generalized abdominal pain: Secondary | ICD-10-CM | POA: Diagnosis not present

## 2019-04-02 DIAGNOSIS — F419 Anxiety disorder, unspecified: Secondary | ICD-10-CM | POA: Diagnosis not present

## 2019-04-03 DIAGNOSIS — Z961 Presence of intraocular lens: Secondary | ICD-10-CM | POA: Diagnosis not present

## 2019-04-03 DIAGNOSIS — H43393 Other vitreous opacities, bilateral: Secondary | ICD-10-CM | POA: Diagnosis not present

## 2019-04-03 DIAGNOSIS — H532 Diplopia: Secondary | ICD-10-CM | POA: Diagnosis not present

## 2019-04-03 DIAGNOSIS — H16223 Keratoconjunctivitis sicca, not specified as Sjogren's, bilateral: Secondary | ICD-10-CM | POA: Diagnosis not present

## 2019-04-10 DIAGNOSIS — H52223 Regular astigmatism, bilateral: Secondary | ICD-10-CM | POA: Diagnosis not present

## 2019-04-10 DIAGNOSIS — H5032 Intermittent alternating esotropia: Secondary | ICD-10-CM | POA: Diagnosis not present

## 2019-05-22 DIAGNOSIS — H16223 Keratoconjunctivitis sicca, not specified as Sjogren's, bilateral: Secondary | ICD-10-CM | POA: Diagnosis not present

## 2019-05-22 DIAGNOSIS — H43393 Other vitreous opacities, bilateral: Secondary | ICD-10-CM | POA: Diagnosis not present

## 2019-05-22 DIAGNOSIS — Z961 Presence of intraocular lens: Secondary | ICD-10-CM | POA: Diagnosis not present

## 2019-05-23 ENCOUNTER — Telehealth: Payer: Self-pay | Admitting: Nurse Practitioner

## 2019-05-23 DIAGNOSIS — R159 Full incontinence of feces: Secondary | ICD-10-CM

## 2019-05-23 NOTE — Telephone Encounter (Signed)
Bre, please have the patient start a probiotic if she is not taking one ie: Phillip's bacteria probiotic. She can take Pepto bismal 2 tabs once daily which can help bulk up stool and tighten her anal sphincter tone. Refer to rectal physical therapy. Pt should follow up in office if sx persist. Thx

## 2019-05-23 NOTE — Telephone Encounter (Signed)
Called and spoke with patient-patient reports she has had a few times lately where she has had a bowel movement without knowing it-"it is just in my panties or comes out while I am in the shower and I am not feeling it come out"; "it keeps happening for the last few months but it seems to keep getting worse"; patient reports she has been doing the kegel exercises for the last few months but has not seen any improvement; "it is embarrassing"  Patient is requesting to know what she needs to do "because I never know when it is going to happen-it is so random"  Please advise

## 2019-05-24 NOTE — Telephone Encounter (Signed)
Called and spoke with patient-patient informed of provider's recommendations; patient is agreeable with plan of care and has requested to have the referral sent to the closest facility to [facility]; Patient verbalized understanding of information/instructions;  Patient was advised to call the office at [phone] if questions/concerns arise; amb ref for rectal PT placed in Epic;

## 2019-05-30 ENCOUNTER — Other Ambulatory Visit: Payer: Self-pay

## 2019-05-30 ENCOUNTER — Ambulatory Visit: Payer: PPO | Attending: Nurse Practitioner | Admitting: Physical Therapy

## 2019-05-30 ENCOUNTER — Encounter: Payer: Self-pay | Admitting: Physical Therapy

## 2019-05-30 DIAGNOSIS — R252 Cramp and spasm: Secondary | ICD-10-CM | POA: Insufficient documentation

## 2019-05-30 DIAGNOSIS — R293 Abnormal posture: Secondary | ICD-10-CM | POA: Diagnosis not present

## 2019-05-30 DIAGNOSIS — M6281 Muscle weakness (generalized): Secondary | ICD-10-CM | POA: Insufficient documentation

## 2019-05-30 NOTE — Therapy (Signed)
[facility] [address], [place], [place], [address] Phone: [phone]   Fax:  [phone]  Physical Therapy Evaluation  Patient Details  Name: Rebecca Fernandez MRN: [id number] Date of Birth: [birth year] Referring Provider (PT): Noralyn Pick, NP   Encounter Date: 05/30/2019  PT End of Session - 05/30/19 1737    Visit Number  1    Date for PT Re-Evaluation  08/22/19    PT Start Time  1532    PT Stop Time  1616    PT Time Calculation (min)  44 min    Activity Tolerance  Patient tolerated treatment well    Behavior During Therapy  [facility] for tasks assessed/performed       Past Medical History:  Diagnosis Date  . Kidney stones     Past Surgical History:  Procedure Laterality Date  . CATARACT EXTRACTION    . FOOT SURGERY    . KIDNEY STONE SURGERY    . LASIK    . PALATE / UVULA BIOPSY / EXCISION      There were no vitals filed for this visit.   Subjective Assessment - 05/30/19 1535    Subjective  Pt reports she doesn't feel when she has leakage.  States she is having incontence ever since a colonoscopy in May 2020.  Pt is not having urinary leakage.  Pt was having abdominal pain and acid reflux which required to have the colonoscopy.    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      Cognition   Overall Cognitive Status  Within Functional Limits for tasks assessed      Posture/Postural Control   Posture/Postural Control  Postural limitations    Postural Limitations  Rounded Shoulders;Posterior pelvic tilt;Left pelvic obliquity;Increased thoracic kyphosis      ROM / Strength   AROM / PROM / Strength  AROM;PROM;Strength      PROM   Overall PROM Comments  hip rotation 30% limited      Strength   Overall Strength Comments  core and hip ext 4/5      Flexibility   Soft Tissue Assessment /Muscle Length  yes    Hamstrings  75%      Palpation   Palpation comment  tight gluteals and lumbar      Ambulation/Gait   Gait Pattern  Within Functional Limits                Objective measurements completed on examination: See above findings.    Pelvic Floor Special Questions - 05/31/19 0001  Currently Sexually Active  --   nervous about it due to leakage   Urinary Leakage  No    Fecal incontinence  Yes    Falling out feeling (prolapse)  No    Prolapse  None    Pelvic Floor Internal Exam  pt identity confirmed and internal soft tissue assessment perfomred    Exam Type  Rectal    Palpation  TTP coccyx; flexed coccyx; tight puborectalis and coccygeus    Strength  fair squeeze, definite lift    Strength # of seconds  4    Tone  high       [facility] Adult PT Treatment/Exercise - 05/31/19 0001      Self-Care   Self-Care  Other Self-Care Comments    Other Self-Care Comments   toileting techniques             PT Education - 05/30/19 1609    Education Details  toileting technique    Person(s) Educated  Patient    Methods  Explanation;Handout;Demonstration    Comprehension  Verbalized understanding;Returned demonstration       PT Short Term Goals - 05/31/19 1913      PT SHORT TERM GOAL #1   Title  ind with intial HEP    Time  4    Period  Weeks    Status  New    Target Date   06/27/19      PT SHORT TERM GOAL #2   Title  Pt will report 25% reduced leakage    Time  4    Period  Weeks    Status  New    Target Date  06/27/19      PT SHORT TERM GOAL #3   Title  Pt will understand toileting techniques and be able to have a complete BM    Baseline  makes multiple trips to the restroom only able to empty small amount of stool    Time  4    Period  Weeks    Status  New    Target Date  06/27/19        PT Long Term Goals - 05/31/19 2003      PT LONG TERM GOAL #1   Title  Pt will report no fecal leakage    Time  12    Period  Weeks    Status  New    Target Date  08/22/19      PT LONG TERM GOAL #2   Title  Pt will maintain neutral pelvis for improved function of pelvic floor muscles    Baseline  Lt anterior obliquity    Time  12    Period  Weeks    Status  New    Target Date  08/22/19      PT LONG TERM GOAL #3   Title  Pt will have improved endurance for at least 20 sec hold of pelvic floor in order to control bowel and bladder function and urge    Baseline  4 sec hold    Time  12    Period  Weeks    Status  New    Target Date  08/22/19      PT LONG TERM GOAL #4   Title  Pt will report at least 50% less pain in tailbone when sitting due to improved sitting posture    Time  12    Period  Weeks    Status  New    Target Date  08/22/19      PT LONG TERM GOAL #5   Title  ind with advanced HEP    Time  12    Period  Weeks    Status  New    Target Date  08/22/19             Plan - 05/31/19 2023    Clinical Impression Statement  Pt presents to skilled PT due to fecal incontinence.  She demonstrates a lot of tension throughout posterior kinetic chain including lumbar, h/s and calves.  Pt has posture deficits mentioned above with collapse of ribcage anteriorly, tight pecs and rounding shoulders.  Pt demonstrates 3/5 anal sphincter strength and low endurance of 4 sec.  Then she has difficulty relaxing and clenches when attempting to evacuate.   Pt is unable to relax puborectalis.  She has high tone and shortened pubococcygeus and excessive flexion of coccyx.  Pt has been getting some tailbone pain when sitting for long periods as well.  Pt will benefit from skilled PT to address these impairments for improved bowel function and quality of life.    Personal Factors and Comorbidities  Profession    Examination-Activity Limitations  Sit;Continence;Toileting;Hygiene/Grooming    Examination-Participation Restrictions  Community Activity;Shop    Stability/Clinical Decision Making  Evolving/Moderate complexity    Clinical Decision Making  Moderate    Rehab Potential  Excellent    PT Frequency  2x / week    PT Duration  12 weeks    PT Treatment/Interventions  ADLs/Self Care Home Management;Biofeedback;Cryotherapy;Electrical Stimulation;Moist Heat;Neuromuscular re-education;Therapeutic exercise;Therapeutic activities;Patient/family education;Manual techniques;Dry needling;Passive range of motion;Taping    PT Next Visit Plan  h/s, hip , lumbarstretches and breathing; review toileting; biofeedback    Consulted and Agree with Plan of Care  Patient       Patient will benefit from skilled therapeutic intervention in order to improve the following deficits and impairments:  Impaired tone, Increased fascial restricitons, Decreased range of motion, Decreased coordination, Increased muscle spasms, Pain, Postural dysfunction, Decreased strength, Decreased endurance  Visit Diagnosis: Cramp and spasm  Muscle weakness (generalized)  Abnormal posture     Problem List Patient Active Problem List   Diagnosis Date Noted  . Abdominal pain, epigastric 03/13/2019  . Renal cyst, native, hemorrhage 01/08/2018  . Palpitations 12/05/2017  . Dyspnea on exertion 12/05/2017  . Coronary artery calcification seen on CT scan 12/05/2017  . 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 PLANTAR FACIITIS 07/08/2008    Jule Ser, PT 05/31/2019, 9:15 PM  [facility] [address], [place], [place], [address] Phone: [phone]   Fax:  [phone]  Name: Rebecca Fernandez MRN: [id number] Date of Birth: 1955-07-12

## 2019-05-30 NOTE — Patient Instructions (Addendum)
Toileting Techniques for Bowel Movements (Defecation) Using your belly (abdomen) and pelvic floor muscles to have a bowel movement is usually instinctive.  Sometimes people can have problems with these muscles and have to relearn proper defecation (emptying) techniques.  If you have weakness in your muscles, organs that are falling out, decreased sensation in your pelvis, or ignore your urge to go, you may find yourself straining to have a bowel movement.  You are straining if you are: . holding your breath or taking in a huge gulp of air and holding it  . keeping your lips and jaw tensed and closed tightly . turning red in the face because of excessive pushing or forcing . developing or worsening your  hemorrhoids . getting faint while pushing . not emptying completely and have to defecate many times a day  If you are straining, you are actually making it harder for yourself to have a bowel movement.  Many people find they are pulling up with the pelvic floor muscles and closing off instead of opening the anus. Due to lack pelvic floor relaxation and coordination the abdominal muscles, one has to work harder to push the feces out.  Many people have never been taught how to defecate efficiently and effectively.  Notice what happens to your body when you are having a bowel movement.  While you are sitting on the toilet pay attention to the following areas: . Jaw and mouth position . Angle of your hips   . Whether your feet touch the ground or not . Arm placement  . Spine position . Waist . Belly tension . Anus (opening of the anal canal)  An Evacuation/Defecation Plan   Here are the 4 basic points:  1. Lean forward enough for your elbows to rest on your knees 2. Support your feet on the floor or use a low stool if your feet don't touch the floor  3. Push out your belly as if you have swallowed a beach ball-you should feel a widening of your waist 4. Open and relax your pelvic floor muscles,  rather than tightening around the anus      The following conditions my require modifications to your toileting posture:  . If you have had surgery in the past that limits your back, hip, pelvic, knee or ankle flexibility . Constipation   Your healthcare practitioner may make the following additional suggestions and adjustments:  1) Sit on the toilet  a) Make sure your feet are supported. b) Notice your hip angle and spine position-most people find it effective to lean forward or raise their knees, which can help the muscles around the anus to relax  c) When you lean forward, place your forearms on your thighs for support  2) Relax suggestions a) Breath deeply in through your nose and out slowly through your mouth as if you are smelling the flowers and blowing out the candles. b) To become aware of how to relax your muscles, contracting and releasing muscles can be helpful.  Pull your pelvic floor muscles in tightly by using the image of holding back gas, or closing around the anus (visualize making a circle smaller) and lifting the anus up and in.  Then release the muscles and your anus should drop down and feel open. Repeat 5 times ending with the feeling of relaxation. c) Keep your pelvic floor muscles relaxed; let your belly bulge out. d) The digestive tract starts at the mouth and ends at the anal opening, so be   sure to relax both ends of the tube.  Place your tongue on the roof of your mouth with your teeth separated.  This helps relax your mouth and will help to relax the anus at the same time.  3) Empty (defecation) a) Keep your pelvic floor and sphincter relaxed, then bulge your anal muscles.  Make the anal opening wide.  b) Stick your belly out as if you have swallowed a beach ball. c) Make your belly wall hard using your belly muscles while continuing to breathe. Doing this makes it easier to open your anus. d) Breath out and give a grunt (or try using other sounds such as  ahhhh, shhhhh, ohhhh or grrrrrrr).  4) Finish a) As you finish your bowel movement, pull the pelvic floor muscles up and in.  This will leave your anus in the proper place rather than remaining pushed out and down. If you leave your anus pushed out and down, it will start to feel as though that is normal and give you incorrect signals about needing to have a bowel movement.   [facility] [address] [place], [place] [address]  

## 2019-06-11 ENCOUNTER — Telehealth: Payer: Self-pay | Admitting: Nurse Practitioner

## 2019-06-11 DIAGNOSIS — R195 Other fecal abnormalities: Secondary | ICD-10-CM

## 2019-06-11 DIAGNOSIS — R1013 Epigastric pain: Secondary | ICD-10-CM

## 2019-06-11 NOTE — Telephone Encounter (Signed)
Bre, pls call patient, send her to lab to have CBC today or tomorrow. She had a normal EGD with Dr. Henrene Pastor 02/2019. Pls schedule her for a follow up visit and inform her not to take any more pepto bismol for now.  If she has chest pain, sob, profound weakness then to the ED. Thx

## 2019-06-11 NOTE — Telephone Encounter (Signed)
Please advise 

## 2019-06-11 NOTE — Telephone Encounter (Signed)
Called and spoke with patient-patient informed of provider's  recommendations; patient is agreeable with plan of care and reports she will complete lab work on 06/12/2019; Patient verbalized understanding of information/instructions;  Patient was advised to call the office at [phone] if questions/concerns arise;   Lab work order in [employer]; patient has been scheduled for OV on 06/14/2019 at 2:00 pm at the [facility] office; patient is aware of [place] location;

## 2019-06-12 ENCOUNTER — Other Ambulatory Visit (INDEPENDENT_AMBULATORY_CARE_PROVIDER_SITE_OTHER): Payer: PPO

## 2019-06-12 DIAGNOSIS — R1013 Epigastric pain: Secondary | ICD-10-CM

## 2019-06-12 DIAGNOSIS — R195 Other fecal abnormalities: Secondary | ICD-10-CM

## 2019-06-12 LAB — CBC WITH DIFFERENTIAL/PLATELET
Basophils Absolute: 0 10*3/uL (ref 0.0–0.1)
Basophils Relative: 0.8 % (ref 0.0–3.0)
Eosinophils Absolute: 0.2 10*3/uL (ref 0.0–0.7)
Eosinophils Relative: 4.8 % (ref 0.0–5.0)
HCT: 37.5 % (ref 36.0–46.0)
Hemoglobin: 12.4 g/dL (ref 12.0–15.0)
Lymphocytes Relative: 26.5 % (ref 12.0–46.0)
Lymphs Abs: 1.3 10*3/uL (ref 0.7–4.0)
MCHC: 33 g/dL (ref 30.0–36.0)
MCV: 88 fl (ref 78.0–100.0)
Monocytes Absolute: 0.5 10*3/uL (ref 0.1–1.0)
Monocytes Relative: 10.1 % (ref 3.0–12.0)
Neutro Abs: 2.8 10*3/uL (ref 1.4–7.7)
Neutrophils Relative %: 57.8 % (ref 43.0–77.0)
Platelets: 182 10*3/uL (ref 150.0–400.0)
RBC: 4.26 Mil/uL (ref 3.87–5.11)
RDW: 13.7 % (ref 11.5–15.5)
WBC: 4.8 10*3/uL (ref 4.0–10.5)

## 2019-06-14 ENCOUNTER — Encounter: Payer: Self-pay | Admitting: Nurse Practitioner

## 2019-06-14 ENCOUNTER — Other Ambulatory Visit: Payer: Self-pay

## 2019-06-14 ENCOUNTER — Ambulatory Visit (INDEPENDENT_AMBULATORY_CARE_PROVIDER_SITE_OTHER): Payer: PPO | Admitting: Nurse Practitioner

## 2019-06-14 VITALS — BP 118/62 | HR 70 | Temp 97.9°F | Ht 61.0 in | Wt 126.0 lb

## 2019-06-14 DIAGNOSIS — K921 Melena: Secondary | ICD-10-CM | POA: Diagnosis not present

## 2019-06-14 MED ORDER — FAMOTIDINE 40 MG PO TABS: 40.0000 mg | ORAL_TABLET | Freq: Every day | ORAL | 1 refills | Status: DC

## 2019-06-14 NOTE — Progress Notes (Signed)
06/14/2019 Rebecca Fernandez [id number] 03-30-1955   Chief Complaint: Black stools   History of Present Illness: Rebecca Fernandez is a 64 year old female with a past medical history of kidney stones and UTIs. S/P uvulaectomy surgery secondary to sleep apnea. Kidney stone surgery 1980. Partial hysterectomy 15 years ago. Right foot plantar fasciitis. I saw the patient in the office on 03/12/2019 with complaints of nausea and epigastric pain.  An abdominal sonogram was done 03/18/2019 showed a normal gallbladder and a stable 4.1 cm left kidney cyst.  She underwent an EGD 03/19/2019 with Dr. Henrene Pastor which was normal.  She contacted our office on 05/23/2019 with complaints of fecal leakage.  She was advised to take a probiotic, to take Pepto-Bismol 2 tabs once daily to bulk up her stool and to increase her anal sphincter tone and she was referred for rectal physical therapy.  She called our office on 06/11/2019 with complaints of passing black stool which she verified started prior to taking Pepto-Bismol.  She was advised to stop taking Pepto-Bismol.  A CBC was ordered with instructions to follow-up in the office today for further evaluation.  CBC 06/12/2019 showed a WBC 4.8.  Hemoglobin 12.4 which is up from Hg 11.8 02/2019.   Hematocrit 37.5.  Platelet 182.  She is passing a small solid black as tar stool 2-3 times daily which usually occurs while she is urinating.  She reported seeing black stool possibly several weeks before she started taking Pepto-Bismol.  She has not taking iron supplements.  She denies taking any recent NSAIDs or steroids.  No iron supplement.  No bright red rectal bleeding.  She denies having any dysphagia or heartburn.  She is taking pantoprazole 40 mg in the morning and famotidine 20 mg at bedtime.  No upper or lower abdominal pain.  He describes feeling as if something is off.  Energy level is okay.  Appetite is good and her weight is stable. 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   EGD 03/19/2019: - Normal esophagus. - Normal stomach with small hiatal hernia. - Normal examined duodenum. - No specimens collected.  Colonoscopy by Dr. 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  Colonoscopy 06/09/2005 by Dr. Henrene Pastor: Normal. 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    CBC Latest Ref Rng & Units 06/12/2019 03/14/2019 01/17/2019  WBC 4.0 - 10.5 K/uL 4.8 4.7 5.5  Hemoglobin 12.0 - 15.0 g/dL 12.4 11.8(L) 12.8  Hematocrit 36.0 - 46.0 % 37.5 35.5(L) 39.3  Platelets 150.0 - 400.0 K/uL 182.0 194.0 231   Current Outpatient Medications on File Prior to Visit  Medication Sig Dispense Refill  . buPROPion (WELLBUTRIN XL) 300 MG 24 hr tablet Take 1 tablet by mouth daily.    . clonazePAM (KLONOPIN) 1 MG tablet Take 1 mg by mouth 2 (two) times daily.    Marland Kitchen dicyclomine (BENTYL) 10 MG capsule Take 1 capsule (10 mg total) by mouth every 8 (eight) hours as needed for spasms. 30 capsule 0  . estradiol (ESTRACE) 0.5 MG tablet Take 0.5 mg by mouth daily.     . famotidine (PEPCID) 20 MG tablet Take 20 mg by mouth.    . pantoprazole (PROTONIX) 40 MG tablet Take 1 tablet by mouth daily.    Marland Kitchen VITAMIN A PO Take 1 tablet by mouth 2 (two) times daily.    Marland Kitchen XIIDRA 5 % SOLN Place 1 drop into both eyes 2 (two) times daily.    Marland Kitchen  zolpidem (AMBIEN) 10 MG tablet Take 10 mg by mouth at bedtime as needed.      Current Facility-Administered Medications on File Prior to Visit  Medication Dose Route Frequency Provider Last Rate Last Admin  . technetium pyrophosphate Tc 50m injection 8.8 millicurie  8.8 millicurie Intravenous Once Larey Dresser, MD         Current Medications, Allergies, Past Medical History, Past Surgical History, Family History and Social History were reviewed in [employer] record.   Physical Exam: BP 118/62   Pulse 70   Temp 97.9 F (36.6 C)   Ht 5\' 1"  (1.549 m)   Wt 126 lb (57.2 kg)   BMI 23.81  kg/m  General: Well developed  64 year old female in no acute distress. Head: Normocephalic and atraumatic. Eyes:  No scleral icterus. Conjunctiva pink . Ears: Normal auditory acuity. Lungs: Clear throughout to auscultation. Heart: Regular rate and rhythm, no murmur. Abdomen: Soft, nontender and nondistended. No masses or hepatomegaly. Normal bowel sounds x 4 quadrants.  Rectal: Dark brown almost black soft stool, trace guaiac positive. No mass.  Musculoskeletal: Symmetrical with no gross deformities. Extremities: No edema. Neurological: Alert oriented x 4. No focal deficits.  Psychological:  Alert and cooperative. Normal mood and affect  Assessment and Recommendations:  5.  64 year old female history of GERD with melenic appearing stools which started prior to Pepto-Bismol use and continued after Pepto-Bismol discontinued 4 days ago.  Rectal exam today showed soft dark brown black-colored stool trace guaiac positive.  EGD 02/2027 was normal. No anemia. Hg 12.4 up from 11.8.  -Continue Pantoprazole 40 mg every morning.  Increase Famotidine 40 mg at bedtime. -I will consult with Dr. Henrene Pastor to discuss scheduling a small bowel capsule endoscopy.  2. Colonoscopy 07/2018 per Dr. Collene Mares showed melanosis, no polyps.

## 2019-06-14 NOTE — Patient Instructions (Addendum)
If you are age 66 or older, your body mass index should be between 23-30. Your Body mass index is 23.81 kg/m. If this is out of the aforementioned range listed, please consider follow up with your Primary Care Provider.  If you are age 51 or younger, your body mass index should be between 19-25. Your Body mass index is 23.81 kg/m. If this is out of the aformentioned range listed, please consider follow up with your Primary Care Provider.   No Pepto Bismol. Call our office if you have frequent, loose black stools. Jaclyn Shaggy, NP will speak with Dr Henrene Pastor to determine his recommendations.  Continue taking Pantoprazole 40mg   Every morning Famotadine 40 mg at bedtime   Due to recent changes in healthcare laws, you may see the results of your imaging and laboratory studies on MyChart before your provider has had a chance to review them.  We understand that in some cases there may be results that are confusing or concerning to you. Not all laboratory results come back in the same time frame and the provider may be waiting for multiple results in order to interpret others.  Please give [place] 48 hours in order for your provider to thoroughly review all the results before contacting the office for clarification of your results.

## 2019-06-17 NOTE — Progress Notes (Signed)
Reviewed. If no contraindication, proceed with capsule endoscopy. Thanks

## 2019-06-18 ENCOUNTER — Encounter: Payer: Self-pay | Admitting: Physical Therapy

## 2019-06-18 ENCOUNTER — Ambulatory Visit: Payer: PPO | Admitting: Physical Therapy

## 2019-06-18 ENCOUNTER — Other Ambulatory Visit: Payer: Self-pay

## 2019-06-18 DIAGNOSIS — R252 Cramp and spasm: Secondary | ICD-10-CM | POA: Diagnosis not present

## 2019-06-18 DIAGNOSIS — M6281 Muscle weakness (generalized): Secondary | ICD-10-CM

## 2019-06-18 DIAGNOSIS — R293 Abnormal posture: Secondary | ICD-10-CM

## 2019-06-18 NOTE — Therapy (Signed)
[facility] [address], [place], [place], [address] Phone: [phone]   Fax:  [phone]  Physical Therapy Treatment  Patient Details  Name: Rebecca Fernandez MRN: [id number] Date of Birth: 1955-12-16 Referring Provider (PT): Noralyn Pick, NP   Encounter Date: 06/18/2019  PT End of Session - 06/18/19 1241    Visit Number  2    Date for PT Re-Evaluation  08/22/19    PT Start Time  1237    PT Stop Time  1317    PT Time Calculation (min)  40 min    Activity Tolerance  Patient tolerated treatment well    Behavior During Therapy  [facility] for tasks assessed/performed       Past Medical History:  Diagnosis Date  . Kidney stones     Past Surgical History:  Procedure Laterality Date  . CATARACT EXTRACTION    . FOOT SURGERY    . KIDNEY STONE SURGERY    . LASIK    . PALATE / UVULA BIOPSY / EXCISION      There were no vitals filed for this visit.  Subjective Assessment - 06/18/19 1241    Subjective  Pt states still have pain in her Lt side and found there was blood in her stool.  doesn't hurt currently but it will hurt and reclining with heat on it helps.    Pertinent History  partial hysterectomy, acid reflux, cyst on kidney    Patient Stated Goals  not have leakage    Currently in Pain?  No/denies                       [facility] Adult PT Treatment/Exercise - 06/18/19 0001      Neuro Re-ed    Neuro Re-ed Details   breathing on ball PF lengthening      Exercises   Exercises  Lumbar      Lumbar Exercises: Stretches   Other Lumbar Stretch Exercise  thoracic rotation stretch 10x 5 sec; thoracic ext with towel and ball against the wall  5 sec x 10    Other Lumbar Stretch Exercise  pball roll out lumbar flexion stretch - 10x 5 sec      Manual Therapy   Manual Therapy  Myofascial release;Soft tissue mobilization    Soft tissue mobilization  diaphragm, lumbar and thoracic paraspinals    Myofascial Release  thoracolumbar fascia and abominal wall ascneding>descending colon             PT Education - 06/18/19 1521    Education Details  Access Code: [email or web address]    Person(s) Educated  Patient    Methods  Explanation;Demonstration;Handout;Verbal cues;Tactile cues    Comprehension  Verbalized understanding;Returned demonstration       PT Short Term Goals - 05/31/19 1913      PT SHORT TERM GOAL #1   Title  ind with intial HEP    Time  4    Period  Weeks    Status  New    Target Date  06/27/19      PT SHORT TERM GOAL #2   Title  Pt will report 25% reduced leakage    Time  4    Period  Weeks    Status  New    Target Date  06/27/19      PT SHORT TERM GOAL #3   Title  Pt will understand toileting techniques and be able to have a complete BM  Baseline  makes multiple trips to the restroom only able to empty small amount of stool    Time  4    Period  Weeks    Status  New    Target Date  06/27/19        PT Long Term Goals - 05/31/19 2003      PT LONG TERM GOAL #1   Title  Pt will report no fecal leakage    Time  12    Period  Weeks    Status  New    Target Date  08/22/19      PT LONG TERM GOAL #2   Title  Pt will maintain neutral pelvis for improved function of pelvic floor muscles    Baseline  Lt anterior obliquity    Time  12    Period  Weeks    Status  New    Target Date  08/22/19      PT LONG TERM GOAL #3   Title  Pt will have improved endurance for at least 20 sec hold of pelvic floor in order to control bowel and bladder function and urge    Baseline  4 sec hold    Time  12    Period  Weeks    Status  New    Target Date  08/22/19      PT LONG TERM GOAL #4   Title  Pt will report at least 50% less pain in tailbone when sitting due to improved sitting posture    Time  12    Period  Weeks    Status  New    Target Date  08/22/19      PT LONG TERM GOAL #5   Title  ind with advanced HEP    Time  12    Period  Weeks    Status  New     Target Date  08/22/19            Plan - 06/18/19 1516    Clinical Impression Statement  Today's session focused on improving thoracic and lumbar mobility. She got a good fascial release from manual treatment.  Pt was able to feel the stretches correctly and pain free.  Pt was educated in diaphragmatic breathing on ball and when doing stretching for improved pelvic floor lengthening and mobility. Long term goals are ongoing.    PT Treatment/Interventions  ADLs/Self Care Home Management;Biofeedback;Cryotherapy;Electrical Stimulation;Moist Heat;Neuromuscular re-education;Therapeutic exercise;Therapeutic activities;Patient/family education;Manual techniques;Dry needling;Passive range of motion;Taping    PT Next Visit Plan  biofeedback and begin strengthening; add h/s, hip , lumbarstretches as needed    Consulted and Agree with Plan of Care  Patient       Patient will benefit from skilled therapeutic intervention in order to improve the following deficits and impairments:  Impaired tone, Increased fascial restricitons, Decreased range of motion, Decreased coordination, Increased muscle spasms, Pain, Postural dysfunction, Decreased strength, Decreased endurance  Visit Diagnosis: Cramp and spasm  Muscle weakness (generalized)  Abnormal posture     Problem List Patient Active Problem List   Diagnosis Date Noted  . Melena 06/14/2019  . Abdominal pain, epigastric 03/13/2019  . Renal cyst, native, hemorrhage 01/08/2018  . Palpitations 12/05/2017  . Dyspnea on exertion 12/05/2017  . Coronary artery calcification seen on CT scan 12/05/2017  . 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 PLANTAR FACIITIS 07/08/2008    Jule Ser, PT 06/18/2019, 3:32 PM  [facility] [address],  [place], [place], [address] Phone: [phone]   Fax:  [phone]  Name: Rebecca Fernandez MRN: [id number] Date of Birth: 01-Mar-1956

## 2019-06-18 NOTE — Patient Instructions (Signed)
Access Code: [email or web address] URL: [email or web address] Date: 06/18/2019 Prepared by: Jari Favre  Exercises Seated Diaphragmatic Breathing - 3 x daily - 7 x weekly - 10 reps - 1 sets Seated Lumbar Flexion Stretch - 1 x daily - 7 x weekly - 1 sets - 10 reps - 10 hold Thoracic Extension Mobilization with Noodle - 1 x daily - 7 x weekly - 1 sets - 10 reps - 10 hold Sidelying Thoracic Lumbar Rotation - 1 x daily - 7 x weekly - 5 reps - 1 sets - 10 sec hold

## 2019-06-19 ENCOUNTER — Telehealth: Payer: Self-pay | Admitting: Nurse Practitioner

## 2019-06-19 DIAGNOSIS — K921 Melena: Secondary | ICD-10-CM

## 2019-06-19 DIAGNOSIS — D649 Anemia, unspecified: Secondary | ICD-10-CM

## 2019-06-19 NOTE — Telephone Encounter (Signed)
Called and spoke with patient-patient informed of provider's recommendations and has been scheduled for capsule endo on 07/17/2019 at 8:30 at the [facility] office; instructions have been sent to the patient via [place] and mail; Patient advised to call back to the office at [phone] should questions/concerns arise;  Patient verbalized understanding of information/instructions;

## 2019-06-19 NOTE — Telephone Encounter (Signed)
Duplicate message-please see additional telephone encounter

## 2019-06-19 NOTE — Addendum Note (Signed)
Addended by: Mohammed Kindle on: 06/19/2019 04:26 PM   Modules accepted: Orders

## 2019-06-19 NOTE — Telephone Encounter (Signed)
Bre, please call the patient to schedule a small bowel capsule endoscopy. I discussed the benefits and risks with the patient including if the capsule got stuck surgery would possibly required to remove the capsule. No history of intestinal blockage. She wishes to proceed with the capsule endoscopy. DX: Anemia. Melena. thx

## 2019-06-19 NOTE — Telephone Encounter (Signed)
Instructions

## 2019-06-19 NOTE — Telephone Encounter (Signed)
Bre, I sent your a msg earlier to contact patient to schedule a small bowel capsule endoscopy. Disregard duplicate msg. thx

## 2019-06-20 ENCOUNTER — Other Ambulatory Visit: Payer: Self-pay

## 2019-06-20 ENCOUNTER — Ambulatory Visit: Payer: PPO | Admitting: Physical Therapy

## 2019-06-20 ENCOUNTER — Encounter: Payer: Self-pay | Admitting: Physical Therapy

## 2019-06-20 DIAGNOSIS — R252 Cramp and spasm: Secondary | ICD-10-CM | POA: Diagnosis not present

## 2019-06-20 DIAGNOSIS — R293 Abnormal posture: Secondary | ICD-10-CM

## 2019-06-20 DIAGNOSIS — M6281 Muscle weakness (generalized): Secondary | ICD-10-CM

## 2019-06-20 NOTE — Therapy (Signed)
[facility] [address], [place], [place], [address] Phone: [phone]   Fax:  [phone]  Physical Therapy Treatment  Patient Details  Name: Rebecca Fernandez MRN: [id number] Date of Birth: [birth year] Referring Provider (PT): Noralyn Pick, NP   Encounter Date: 06/20/2019  PT End of Session - 06/20/19 1540    Visit Number  3    Date for PT Re-Evaluation  08/22/19    PT Start Time  1450    PT Stop Time  1528    PT Time Calculation (min)  38 min    Activity Tolerance  Patient tolerated treatment well    Behavior During Therapy  [facility] for tasks assessed/performed       Past Medical History:  Diagnosis Date  . Kidney stones     Past Surgical History:  Procedure Laterality Date  . CATARACT EXTRACTION    . FOOT SURGERY    . KIDNEY STONE SURGERY    . LASIK    . PALATE / UVULA BIOPSY / EXCISION      There were no vitals filed for this visit.  Subjective Assessment - 06/20/19 1539    Subjective  The massage last time felt good.  States she had some leakage yesterday.    Pertinent History  partial hysterectomy, acid reflux, cyst on kidney    Patient Stated Goals  not have leakage    Currently in Pain?  No/denies                       [facility] Adult PT Treatment/Exercise - 06/20/19 0001      Neuro Re-ed    Neuro Re-ed Details   biofeedback for downtraining; had to stop after press up because not sticking      Lumbar Exercises: Stretches   Double Knee to Chest Stretch Limitations  happy baby with breathing - improved resting tone    Press Ups  2 reps;5 seconds    Figure 4 Stretch  3 reps;20 seconds    Other Lumbar Stretch Exercise  butterfly stretch - 30 sec (no change in resting tone      Lumbar Exercises: Supine   Bridge  10 reps;5 seconds   with kegel   Other Supine Lumbar Exercises  thoracic ext stretch      Lumbar Exercises: Sidelying   Other Sidelying Lumbar Exercises   thoracic rotation             PT Education - 06/20/19 1540    Education Details  Access Code: [email or web address]    Person(s) Educated  Patient    Methods  Explanation;Demonstration;Verbal cues;Handout    Comprehension  Verbalized understanding;Returned demonstration       PT Short Term Goals - 05/31/19 1913      PT SHORT TERM GOAL #1   Title  ind with intial HEP    Time  4    Period  Weeks    Status  New    Target Date  06/27/19      PT SHORT TERM GOAL #2   Title  Pt will report 25% reduced leakage    Time  4    Period  Weeks    Status  New    Target Date  06/27/19      PT SHORT TERM GOAL #3   Title  Pt will understand toileting techniques and be able to have a complete BM    Baseline  makes multiple trips to  the restroom only able to empty small amount of stool    Time  4    Period  Weeks    Status  New    Target Date  06/27/19        PT Long Term Goals - 05/31/19 2003      PT LONG TERM GOAL #1   Title  Pt will report no fecal leakage    Time  12    Period  Weeks    Status  New    Target Date  08/22/19      PT LONG TERM GOAL #2   Title  Pt will maintain neutral pelvis for improved function of pelvic floor muscles    Baseline  Lt anterior obliquity    Time  12    Period  Weeks    Status  New    Target Date  08/22/19      PT LONG TERM GOAL #3   Title  Pt will have improved endurance for at least 20 sec hold of pelvic floor in order to control bowel and bladder function and urge    Baseline  4 sec hold    Time  12    Period  Weeks    Status  New    Target Date  08/22/19      PT LONG TERM GOAL #4   Title  Pt will report at least 50% less pain in tailbone when sitting due to improved sitting posture    Time  12    Period  Weeks    Status  New    Target Date  08/22/19      PT LONG TERM GOAL #5   Title  ind with advanced HEP    Time  12    Period  Weeks    Status  New    Target Date  08/22/19            Plan - 06/20/19 1640    Clinical  Impression Statement  Pt did well with biofeedback until it became disconnected. Able to see the pelvic floor tone lower with stretches down from 45mV to 77mV. Pt will benefit from skilled PT to continue to progress core and pelvic floor strength for reutrn to full activities without leakage.    PT Treatment/Interventions  ADLs/Self Care Home Management;Biofeedback;Cryotherapy;Electrical Stimulation;Moist Heat;Neuromuscular re-education;Therapeutic exercise;Therapeutic activities;Patient/family education;Manual techniques;Dry needling;Passive range of motion;Taping    PT Next Visit Plan  biofeedback and continue begin strengthening; add h/s, hip , lumbarstretches as needed    Consulted and Agree with Plan of Care  Patient       Patient will benefit from skilled therapeutic intervention in order to improve the following deficits and impairments:  Impaired tone, Increased fascial restricitons, Decreased range of motion, Decreased coordination, Increased muscle spasms, Pain, Postural dysfunction, Decreased strength, Decreased endurance  Visit Diagnosis: Cramp and spasm  Muscle weakness (generalized)  Abnormal posture     Problem List Patient Active Problem List   Diagnosis Date Noted  . Melena 06/14/2019  . Abdominal pain, epigastric 03/13/2019  . Renal cyst, native, hemorrhage 01/08/2018  . Palpitations 12/05/2017  . Dyspnea on exertion 12/05/2017  . Coronary artery calcification seen on CT scan 12/05/2017  . 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 PLANTAR FACIITIS 07/08/2008    Jule Ser, PT 06/20/2019, 5:00 PM  [facility] [address] [place], [place], [address] Phone: [phone]   Fax:  [phone]  Name:  Rebecca Fernandez MRN: [id number] Date of Birth: 16-Aug-1955

## 2019-06-20 NOTE — Patient Instructions (Signed)
Access Code: [email or web address] URL: [email or web address] Date: 06/20/2019 Prepared by: Jari Favre  Exercises Seated Diaphragmatic Breathing - 3 x daily - 7 x weekly - 10 reps - 1 sets Seated Lumbar Flexion Stretch - 1 x daily - 7 x weekly - 1 sets - 10 reps - 10 hold Thoracic Extension Mobilization with Noodle - 1 x daily - 7 x weekly - 1 sets - 10 reps - 10 hold Sidelying Thoracic Lumbar Rotation - 1 x daily - 7 x weekly - 5 reps - 1 sets - 10 sec hold Supine Pelvic Floor Stretch - 1 x daily - 7 x weekly - 3 reps - 1 sets - 30 sec hold Supine Piriformis Stretch - 1 x daily - 7 x weekly - 3 reps - 1 sets - 30 sec hold Bridge - 1 x daily - 7 x weekly - 2 sets - 10 reps - 5 sec hold

## 2019-06-25 ENCOUNTER — Encounter: Payer: PPO | Admitting: Physical Therapy

## 2019-06-27 ENCOUNTER — Encounter: Payer: PPO | Admitting: Physical Therapy

## 2019-06-27 DIAGNOSIS — M546 Pain in thoracic spine: Secondary | ICD-10-CM | POA: Diagnosis not present

## 2019-06-27 DIAGNOSIS — M9902 Segmental and somatic dysfunction of thoracic region: Secondary | ICD-10-CM | POA: Diagnosis not present

## 2019-06-27 DIAGNOSIS — M545 Low back pain: Secondary | ICD-10-CM | POA: Diagnosis not present

## 2019-06-27 DIAGNOSIS — M9903 Segmental and somatic dysfunction of lumbar region: Secondary | ICD-10-CM | POA: Diagnosis not present

## 2019-06-27 DIAGNOSIS — M9905 Segmental and somatic dysfunction of pelvic region: Secondary | ICD-10-CM | POA: Diagnosis not present

## 2019-06-27 DIAGNOSIS — M9901 Segmental and somatic dysfunction of cervical region: Secondary | ICD-10-CM | POA: Diagnosis not present

## 2019-06-27 DIAGNOSIS — M5413 Radiculopathy, cervicothoracic region: Secondary | ICD-10-CM | POA: Diagnosis not present

## 2019-07-02 ENCOUNTER — Encounter: Payer: PPO | Admitting: Physical Therapy

## 2019-07-04 ENCOUNTER — Encounter: Payer: PPO | Admitting: Physical Therapy

## 2019-07-06 ENCOUNTER — Other Ambulatory Visit: Payer: Self-pay | Admitting: Nurse Practitioner

## 2019-07-09 ENCOUNTER — Other Ambulatory Visit: Payer: Self-pay

## 2019-07-09 ENCOUNTER — Encounter: Payer: Self-pay | Admitting: Physical Therapy

## 2019-07-09 ENCOUNTER — Ambulatory Visit: Payer: PPO | Attending: Nurse Practitioner | Admitting: Physical Therapy

## 2019-07-09 DIAGNOSIS — R252 Cramp and spasm: Secondary | ICD-10-CM | POA: Diagnosis not present

## 2019-07-09 DIAGNOSIS — R293 Abnormal posture: Secondary | ICD-10-CM | POA: Insufficient documentation

## 2019-07-09 DIAGNOSIS — M6281 Muscle weakness (generalized): Secondary | ICD-10-CM | POA: Diagnosis not present

## 2019-07-09 NOTE — Therapy (Signed)
[facility] [address] [place], [place], [address] Phone: [phone]   Fax:  [phone]  Physical Therapy Treatment  Patient Details  Name: Rebecca Fernandez MRN: [id number] Date of Birth: 08-22-1955 Referring Provider (PT): Noralyn Pick, NP   Encounter Date: 07/09/2019  PT End of Session - 07/09/19 1615    Visit Number  4    Date for PT Re-Evaluation  08/22/19    PT Start Time  1615    PT Stop Time  1704    PT Time Calculation (min)  49 min    Activity Tolerance  Patient tolerated treatment well    Behavior During Therapy  [facility] for tasks assessed/performed       Past Medical History:  Diagnosis Date  . Kidney stones     Past Surgical History:  Procedure Laterality Date  . CATARACT EXTRACTION    . FOOT SURGERY    . KIDNEY STONE SURGERY    . LASIK    . PALATE / UVULA BIOPSY / EXCISION      There were no vitals filed for this visit.  Subjective Assessment - 07/09/19 1618    Subjective  I can never tell.  Pt states she is still just having little pieces coming out. my left side and low back has been hurting this last week.    Pertinent History  partial hysterectomy, acid reflux, cyst on kidney    Patient Stated Goals  not have leakage    Currently in Pain?  No/denies                       [facility] Adult PT Treatment/Exercise - 07/09/19 0001      Neuro Re-ed    Neuro Re-ed Details   tactile cues to work on bulging - internal cues given with identity confirmed and patient consent      Lumbar Exercises: Stretches   Other Lumbar Stretch Exercise  seated thoracic rotation with extensionl thoracic sidebending    Other Lumbar Stretch Exercise  cervical retractions in supine; thoracic ext in supine with noodle      Manual Therapy   Myofascial Release  shoulder girdle; hyoid; C1 and suboccipital release             PT Education - 07/09/19 1706    Education Details  Access  Code: [email or web address]    Person(s) Educated  Patient    Methods  Explanation;Demonstration;Verbal cues;Handout    Comprehension  Verbalized understanding;Returned demonstration       PT Short Term Goals - 07/09/19 1712      PT SHORT TERM GOAL #1   Title  ind with intial HEP    Status  Achieved      PT SHORT TERM GOAL #2   Title  Pt will report 25% reduced leakage    Baseline  same    Status  Not Met      PT SHORT TERM GOAL #3   Title  Pt will understand toileting techniques and be able to have a complete BM    Baseline  makes multiple trips to the restroom only able to empty small amount of stool    Status  Not Met        PT Long Term Goals - 05/31/19 2003      PT LONG TERM GOAL #1   Title  Pt will report no fecal leakage    Time  12    Period  Weeks    Status  New    Target Date  08/22/19      PT LONG TERM GOAL #2   Title  Pt will maintain neutral pelvis for improved function of pelvic floor muscles    Baseline  Lt anterior obliquity    Time  12    Period  Weeks    Status  New    Target Date  08/22/19      PT LONG TERM GOAL #3   Title  Pt will have improved endurance for at least 20 sec hold of pelvic floor in order to control bowel and bladder function and urge    Baseline  4 sec hold    Time  12    Period  Weeks    Status  New    Target Date  08/22/19      PT LONG TERM GOAL #4   Title  Pt will report at least 50% less pain in tailbone when sitting due to improved sitting posture    Time  12    Period  Weeks    Status  New    Target Date  08/22/19      PT LONG TERM GOAL #5   Title  ind with advanced HEP    Time  12    Period  Weeks    Status  New    Target Date  08/22/19            Plan - 07/09/19 1707    Clinical Impression Statement  Pt responded well to treatment with focus on postural corrections and muscle coordination with pushing.  Pt able to bulge better with more lumbar extension and bowing out like blowing a balloon.Pt responded well to  fascial release especially to C1 and suboccipitals.  Pt has left thoracic rotation and tension on left thorax.  Stretches added to HEP to improve these postural abnormalities.    PT Treatment/Interventions  ADLs/Self Care Home Management;Biofeedback;Cryotherapy;Electrical Stimulation;Moist Heat;Neuromuscular re-education;Therapeutic exercise;Therapeutic activities;Patient/family education;Manual techniques;Dry needling;Passive range of motion;Taping    PT Next Visit Plan  f/u on additions to HEP and continue to do neuro re-ed of posture in sitting and standing.    PT Home Exercise Plan  Access Code: [email or web address]    Consulted and Agree with Plan of Care  Patient       Patient will benefit from skilled therapeutic intervention in order to improve the following deficits and impairments:  Impaired tone, Increased fascial restricitons, Decreased range of motion, Decreased coordination, Increased muscle spasms, Pain, Postural dysfunction, Decreased strength, Decreased endurance  Visit Diagnosis: Cramp and spasm  Muscle weakness (generalized)  Abnormal posture     Problem List Patient Active Problem List   Diagnosis Date Noted  . Melena 06/14/2019  . Abdominal pain, epigastric 03/13/2019  . Renal cyst, native, hemorrhage 01/08/2018  . Palpitations 12/05/2017  . Dyspnea on exertion 12/05/2017  . Coronary artery calcification seen on CT scan 12/05/2017  . 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 PLANTAR FACIITIS 07/08/2008    Jule Ser, PT 07/09/2019, 5:15 PM  [facility] [address], [place], [place], [address] Phone: [phone]   Fax:  [phone]  Name: Rebecca Fernandez MRN: [id number] Date of Birth: 02/22/1956

## 2019-07-09 NOTE — Patient Instructions (Signed)
Access Code: [email or web address] URL: [email or web address] Date: 07/09/2019 Prepared by: Jari Favre  Exercises Seated Diaphragmatic Breathing - 3 x daily - 7 x weekly - 10 reps - 1 sets Seated Lumbar Flexion Stretch - 1 x daily - 7 x weekly - 1 sets - 10 reps - 10 hold Thoracic Extension Mobilization with Noodle - 1 x daily - 7 x weekly - 1 sets - 10 reps - 10 hold Sidelying Thoracic Lumbar Rotation - 1 x daily - 7 x weekly - 5 reps - 1 sets - 10 sec hold Supine Pelvic Floor Stretch - 1 x daily - 7 x weekly - 3 reps - 1 sets - 30 sec hold Supine Piriformis Stretch - 1 x daily - 7 x weekly - 3 reps - 1 sets - 30 sec hold Bridge - 1 x daily - 7 x weekly - 2 sets - 10 reps - 5 sec hold Static Prone on Elbows - 1 x daily - 7 x weekly - 10 reps - 3 sets Supine Cervical Retraction with Towel - 1 x daily - 7 x weekly - 1 sets - 10 reps - 5 sec hold Seated Sidebending - 1 x daily - 7 x weekly - 10 reps - 3 sets Seated Thoracic Extension and Rotation with Reach - 1 x daily - 7 x weekly - 10 reps - 3 sets

## 2019-07-11 ENCOUNTER — Encounter: Payer: Self-pay | Admitting: Physical Therapy

## 2019-07-11 ENCOUNTER — Other Ambulatory Visit: Payer: Self-pay

## 2019-07-11 ENCOUNTER — Ambulatory Visit: Payer: PPO | Admitting: Physical Therapy

## 2019-07-11 DIAGNOSIS — R252 Cramp and spasm: Secondary | ICD-10-CM | POA: Diagnosis not present

## 2019-07-11 DIAGNOSIS — R293 Abnormal posture: Secondary | ICD-10-CM

## 2019-07-11 DIAGNOSIS — M6281 Muscle weakness (generalized): Secondary | ICD-10-CM

## 2019-07-11 NOTE — Therapy (Signed)
[facility] [address] [place], [place], [address] Phone: [phone]   Fax:  [phone]  Physical Therapy Treatment  Patient Details  Name: Rebecca Fernandez MRN: [id number] Date of Birth: 1956-02-25 Referring Provider (PT): Noralyn Pick, NP   Encounter Date: 07/11/2019  PT End of Session - 07/11/19 1617    Visit Number  5    Date for PT Re-Evaluation  08/22/19    PT Start Time  1660    PT Stop Time  1658    PT Time Calculation (min)  41 min    Activity Tolerance  Patient tolerated treatment well    Behavior During Therapy  [facility] for tasks assessed/performed       Past Medical History:  Diagnosis Date  . Kidney stones     Past Surgical History:  Procedure Laterality Date  . CATARACT EXTRACTION    . FOOT SURGERY    . KIDNEY STONE SURGERY    . LASIK    . PALATE / UVULA BIOPSY / EXCISION      There were no vitals filed for this visit.  Subjective Assessment - 07/11/19 1707    Subjective  i can feel the stretches and the blowing up a balloon is helping with BM    Patient Stated Goals  not have leakage    Currently in Pain?  No/denies                       [facility] Adult PT Treatment/Exercise - 07/11/19 0001      Lumbar Exercises: Standing   Forward Lunge Limitations  lunge on sliders    Theraband Level (Row)  Level 2 (Red)    Row Limitations  low row - red    Shoulder Extension  Strengthening;Both;20 reps;Theraband    Theraband Level (Shoulder Extension)  Level 2 (Red)      Lumbar Exercises: Seated   Other Seated Lumbar Exercises  SCM stretch      Lumbar Exercises: Prone   Other Prone Lumbar Exercises  prone press; POE cervical retraction; POE cervical rotation      Manual Therapy   Soft tissue mobilization  thoracic and lumbar paraspinals       Trigger Point Dry Needling - 07/11/19 0001    Consent Given?  Yes    Education Handout Provided  --   verbally reviewed -  will give handout next session   Muscles Treated Back/Hip  Thoracic multifidi;Lumbar multifidi    Lumbar multifidi Response  Twitch response elicited;Palpable increased muscle length    Thoracic multifidi response  Twitch response elicited;Palpable increased muscle length           PT Education - 07/11/19 1706    Education Details  Access Code: [email or web address]    Person(s) Educated  Patient    Methods  Explanation;Demonstration;Handout;Verbal cues    Comprehension  Verbalized understanding;Returned demonstration       PT Short Term Goals - 07/09/19 1712      PT SHORT TERM GOAL #1   Title  ind with intial HEP    Status  Achieved      PT SHORT TERM GOAL #2   Title  Pt will report 25% reduced leakage    Baseline  same    Status  Not Met      PT SHORT TERM GOAL #3   Title  Pt will understand toileting techniques and be able to have a complete BM  Baseline  makes multiple trips to the restroom only able to empty small amount of stool    Status  Not Met        PT Long Term Goals - 07/11/19 1653      PT LONG TERM GOAL #1   Title  Pt will report no fecal leakage    Status  On-going      PT LONG TERM GOAL #2   Title  Pt will maintain neutral pelvis for improved function of pelvic floor muscles    Status  On-going      PT LONG TERM GOAL #3   Title  Pt will have improved endurance for at least 20 sec hold of pelvic floor in order to control bowel and bladder function and urge    Baseline  added holding time to exercises during session today    Status  On-going      PT LONG TERM GOAL #4   Title  Pt will report at least 50% less pain in tailbone when sitting due to improved sitting posture    Baseline  maybe a little better    Status  On-going      PT LONG TERM GOAL #5   Title  ind with advanced HEP    Status  On-going            Plan - 07/11/19 1703    Clinical Impression Statement  Pt demonstrates much improved posture today.  She states the blowing technique for  BM is helping and she has been doing the stretches at home.  PT focused on posture and core strength in standing with core and pelvic floor activation during all exercises.  Pt will benefit from skilled PT to porgress the strength and endurance which is still low level at this time.    PT Treatment/Interventions  ADLs/Self Care Home Management;Biofeedback;Cryotherapy;Electrical Stimulation;Moist Heat;Neuromuscular re-education;Therapeutic exercise;Therapeutic activities;Patient/family education;Manual techniques;Dry needling;Passive range of motion;Taping    PT Next Visit Plan  f/u on additions to HEP and continue to do neuro re-ed of posture in sitting and standing, gluteal strength added to HEP next    PT Home Exercise Plan  Access Code: [email or web address]    Consulted and Agree with Plan of Care  Patient       Patient will benefit from skilled therapeutic intervention in order to improve the following deficits and impairments:  Impaired tone, Increased fascial restricitons, Decreased range of motion, Decreased coordination, Increased muscle spasms, Pain, Postural dysfunction, Decreased strength, Decreased endurance  Visit Diagnosis: Cramp and spasm  Muscle weakness (generalized)  Abnormal posture     Problem List Patient Active Problem List   Diagnosis Date Noted  . Melena 06/14/2019  . Abdominal pain, epigastric 03/13/2019  . Renal cyst, native, hemorrhage 01/08/2018  . Palpitations 12/05/2017  . Dyspnea on exertion 12/05/2017  . Coronary artery calcification seen on CT scan 12/05/2017  . 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 PLANTAR FACIITIS 07/08/2008    Jule Ser, PT 07/11/2019, 5:15 PM  [facility] [address], [place], [place], [address] Phone: [phone]   Fax:  [phone]  Name: Rebecca Fernandez MRN: [id number] Date of Birth: 03/14/1956

## 2019-07-11 NOTE — Patient Instructions (Signed)
Access Code: [email or web address] URL: [email or web address] Date: 07/11/2019 Prepared by: Jari Favre  Exercises Seated Diaphragmatic Breathing - 3 x daily - 7 x weekly - 10 reps - 1 sets Seated Lumbar Flexion Stretch - 1 x daily - 7 x weekly - 1 sets - 10 reps - 10 hold Thoracic Extension Mobilization with Noodle - 1 x daily - 7 x weekly - 1 sets - 10 reps - 10 hold Sidelying Thoracic Lumbar Rotation - 1 x daily - 7 x weekly - 5 reps - 1 sets - 10 sec hold Supine Pelvic Floor Stretch - 1 x daily - 7 x weekly - 3 reps - 1 sets - 30 sec hold Supine Piriformis Stretch - 1 x daily - 7 x weekly - 3 reps - 1 sets - 30 sec hold Bridge - 1 x daily - 7 x weekly - 2 sets - 10 reps - 5 sec hold Static Prone on Elbows - 1 x daily - 7 x weekly - 10 reps - 3 sets Supine Cervical Retraction with Towel - 1 x daily - 7 x weekly - 1 sets - 10 reps - 5 sec hold Seated Sidebending - 1 x daily - 7 x weekly - 10 reps - 3 sets Seated Thoracic Extension and Rotation with Reach - 1 x daily - 7 x weekly - 10 reps - 3 sets Sternocleidomastoid Stretch - 1 x daily - 7 x weekly - 3 reps - 1 sets - 30 sec hold

## 2019-07-16 ENCOUNTER — Ambulatory Visit: Payer: PPO | Admitting: Physical Therapy

## 2019-07-16 ENCOUNTER — Encounter: Payer: Self-pay | Admitting: Physical Therapy

## 2019-07-16 ENCOUNTER — Other Ambulatory Visit: Payer: Self-pay

## 2019-07-16 DIAGNOSIS — R252 Cramp and spasm: Secondary | ICD-10-CM | POA: Diagnosis not present

## 2019-07-16 DIAGNOSIS — R293 Abnormal posture: Secondary | ICD-10-CM

## 2019-07-16 DIAGNOSIS — M6281 Muscle weakness (generalized): Secondary | ICD-10-CM

## 2019-07-16 NOTE — Patient Instructions (Addendum)
Trigger Point Dry Needling  . What is Trigger Point Dry Needling (DN)? o DN is a physical therapy technique used to treat muscle pain and dysfunction. Specifically, DN helps deactivate muscle trigger points (muscle knots).  o A thin filiform needle is used to penetrate the skin and stimulate the underlying trigger point. The goal is for a local twitch response (LTR) to occur and for the trigger point to relax. No medication of any kind is injected during the procedure.   . What Does Trigger Point Dry Needling Feel Like?  o The procedure feels different for each individual patient. Some patients report that they do not actually feel the needle enter the skin and overall the process is not painful. Very mild bleeding may occur. However, many patients feel a deep cramping in the muscle in which the needle was inserted. This is the local twitch response.   Marland Kitchen How Will I feel after the treatment? o Soreness is normal, and the onset of soreness may not occur for a few hours. Typically this soreness does not last longer than two days.  o Bruising is uncommon, however; ice can be used to decrease any possible bruising.  o In rare cases feeling tired or nauseous after the treatment is normal. In addition, your symptoms may get worse before they get better, this period will typically not last longer than 24 hours.   . What Can I do After My Treatment? o Increase your hydration by drinking more water for the next 24 hours. o You may place ice or heat on the areas treated that have become sore, however, do not use heat on inflamed or bruised areas. Heat often brings more relief post needling. o You can continue your regular activities, but vigorous activity is not recommended initially after the treatment for 24 hours. o DN is best combined with other physical therapy such as strengthening, stretching, and other therapies.    [facility] [address] [place], [place]  [address] Phone # [phone] Fax [phone] Access Code: [email or web address] URL: [email or web address] Date: 07/16/2019 Prepared by: Jari Favre  Exercises Seated Diaphragmatic Breathing - 3 x daily - 7 x weekly - 10 reps - 1 sets Seated Lumbar Flexion Stretch - 1 x daily - 7 x weekly - 1 sets - 10 reps - 10 hold Thoracic Extension Mobilization with Noodle - 1 x daily - 7 x weekly - 1 sets - 10 reps - 10 hold Sidelying Thoracic Lumbar Rotation - 1 x daily - 7 x weekly - 5 reps - 1 sets - 10 sec hold Supine Pelvic Floor Stretch - 1 x daily - 7 x weekly - 3 reps - 1 sets - 30 sec hold Supine Piriformis Stretch - 1 x daily - 7 x weekly - 3 reps - 1 sets - 30 sec hold Bridge - 1 x daily - 7 x weekly - 2 sets - 10 reps - 5 sec hold Static Prone on Elbows - 1 x daily - 7 x weekly - 10 reps - 3 sets Supine Cervical Retraction with Towel - 1 x daily - 7 x weekly - 1 sets - 10 reps - 5 sec hold Seated Sidebending - 1 x daily - 7 x weekly - 10 reps - 3 sets Seated Thoracic Extension and Rotation with Reach - 1 x daily - 7 x weekly - 10 reps - 3 sets Sternocleidomastoid Stretch - 1 x daily - 7 x weekly - 3 reps - 1 sets -  30 sec hold Quadruped Alternating Arm Lift - 1 x daily - 7 x weekly - 10 reps - 2 sets Cat-Camel - 1 x daily - 7 x weekly - 10 reps - 3 sets Supine Bridge with Resistance Band - 1 x daily - 7 x weekly - 1 sets - 5 reps - 10 sec hold

## 2019-07-16 NOTE — Therapy (Signed)
[facility] [address] [place], [place], [address] Phone: [phone]   Fax:  [phone]  Physical Therapy Treatment  Patient Details  Name: Rebecca Fernandez MRN: [id number] Date of Birth: 12-Feb-1956 Referring Provider (PT): Noralyn Pick, NP   Encounter Date: 07/16/2019  PT End of Session - 07/16/19 1621    Visit Number  6    Date for PT Re-Evaluation  08/22/19    PT Start Time  1619    PT Stop Time  1659    PT Time Calculation (min)  40 min    Activity Tolerance  Patient tolerated treatment well    Behavior During Therapy  [facility] for tasks assessed/performed       Past Medical History:  Diagnosis Date  . Kidney stones     Past Surgical History:  Procedure Laterality Date  . CATARACT EXTRACTION    . FOOT SURGERY    . KIDNEY STONE SURGERY    . LASIK    . PALATE / UVULA BIOPSY / EXCISION      There were no vitals filed for this visit.  Subjective Assessment - 07/16/19 1713    Subjective  I still feel like something is stuck.  2-3 days without leakage but then have it again    Patient Stated Goals  not have leakage    Currently in Pain?  No/denies                       [facility] Adult PT Treatment/Exercise - 07/16/19 0001      Lumbar Exercises: Prone   Straight Leg Raise  10 reps    Other Prone Lumbar Exercises  prone press; POE cervical retraction; POE cervical rotation      Lumbar Exercises: Quadruped   [place]cat/Old Horse  10 reps    Single Arm Raise  10 reps    Other Quadruped Lumbar Exercises  donkey kick - 3x each side - started compensating with rectus abs      Manual Therapy   Soft tissue mobilization  rectus abdominus trigger points bilateral             PT Education - 07/16/19 1709    Education Details  Access Code: [email or web address] and dry needling info    Person(s) Educated  Patient    Methods  Explanation;Demonstration;Verbal cues;Handout    Comprehension   Verbalized understanding;Returned demonstration       PT Short Term Goals - 07/16/19 1642      PT SHORT TERM GOAL #1   Title  ind with intial HEP    Status  Achieved      PT SHORT TERM GOAL #2   Title  Pt will report 25% reduced leakage    Baseline  2-3 days it is better but not gone        PT Long Term Goals - 07/11/19 1653      PT LONG TERM GOAL #1   Title  Pt will report no fecal leakage    Status  On-going      PT LONG TERM GOAL #2   Title  Pt will maintain neutral pelvis for improved function of pelvic floor muscles    Status  On-going      PT LONG TERM GOAL #3   Title  Pt will have improved endurance for at least 20 sec hold of pelvic floor in order to control bowel and bladder function and urge  Baseline  added holding time to exercises during session today    Status  On-going      PT LONG TERM GOAL #4   Title  Pt will report at least 50% less pain in tailbone when sitting due to improved sitting posture    Baseline  maybe a little better    Status  On-going      PT LONG TERM GOAL #5   Title  ind with advanced HEP    Status  On-going            Plan - 07/16/19 1710    Clinical Impression Statement  Pt reports feeling alittle better overall and is feeling her core and pelvic floor strengthening. Pt is still having a pain and feeling like something is stuck in her abdomen.  She has very tight and TTP rectus abdominus at proximal attachment.  pt was able to progress strength and added to HEP today.    PT Treatment/Interventions  ADLs/Self Care Home Management;Biofeedback;Cryotherapy;Electrical Stimulation;Moist Heat;Neuromuscular re-education;Therapeutic exercise;Therapeutic activities;Patient/family education;Manual techniques;Dry needling;Passive range of motion;Taping    PT Next Visit Plan  f/u on additions to HEP and continue to do neuro re-ed of posture in sitting and standing, gluteal strength, eliptical, side bending on ball    PT Home Exercise Plan   Access Code: [email or web address]    Consulted and Agree with Plan of Care  Patient       Patient will benefit from skilled therapeutic intervention in order to improve the following deficits and impairments:  Impaired tone, Increased fascial restricitons, Decreased range of motion, Decreased coordination, Increased muscle spasms, Pain, Postural dysfunction, Decreased strength, Decreased endurance  Visit Diagnosis: Cramp and spasm  Muscle weakness (generalized)  Abnormal posture     Problem List Patient Active Problem List   Diagnosis Date Noted  . Melena 06/14/2019  . Abdominal pain, epigastric 03/13/2019  . Renal cyst, native, hemorrhage 01/08/2018  . Palpitations 12/05/2017  . Dyspnea on exertion 12/05/2017  . Coronary artery calcification seen on CT scan 12/05/2017  . 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 PLANTAR FACIITIS 07/08/2008    Jule Ser, PT 07/16/2019, 5:17 PM  [facility] [address], [place], [place], [address] Phone: [phone]   Fax:  [phone]  Name: Rebecca Fernandez MRN: [id number] Date of Birth: 02-22-1956

## 2019-07-17 ENCOUNTER — Ambulatory Visit (INDEPENDENT_AMBULATORY_CARE_PROVIDER_SITE_OTHER): Payer: PPO | Admitting: Internal Medicine

## 2019-07-17 ENCOUNTER — Encounter: Payer: Self-pay | Admitting: Gastroenterology

## 2019-07-17 ENCOUNTER — Other Ambulatory Visit: Payer: Self-pay

## 2019-07-17 DIAGNOSIS — K921 Melena: Secondary | ICD-10-CM

## 2019-07-17 DIAGNOSIS — D649 Anemia, unspecified: Secondary | ICD-10-CM

## 2019-07-17 NOTE — Progress Notes (Signed)
SN: [id number] Exp: 2020-07-03 LOT: 05-18-3  Patient arrived for VCE. Reported the prep went well. This RN explained capsule retrieval process and dietary restrictions for the next few hours. Patient verbalized understanding. Opened capsule, ensured capsule was flashing prior to the patient swallowing the capsule. Patient swallowed capsule without difficulty. Patient given retrieval supplies. Patient told to call the office with any q uestions and if no capsule was retrieved after 72 hours. No further questions by the conclusion of the visit.

## 2019-07-18 ENCOUNTER — Encounter: Payer: PPO | Admitting: Physical Therapy

## 2019-07-19 ENCOUNTER — Telehealth: Payer: Self-pay | Admitting: Internal Medicine

## 2019-07-19 ENCOUNTER — Ambulatory Visit (INDEPENDENT_AMBULATORY_CARE_PROVIDER_SITE_OTHER)
Admission: RE | Admit: 2019-07-19 | Discharge: 2019-07-19 | Disposition: A | Discharge status: Home / Self Care | Payer: PPO | Source: Ambulatory Visit | Attending: Internal Medicine | Admitting: Internal Medicine

## 2019-07-19 ENCOUNTER — Other Ambulatory Visit: Payer: Self-pay

## 2019-07-19 DIAGNOSIS — T184XXA Foreign body in colon, initial encounter: Secondary | ICD-10-CM | POA: Diagnosis not present

## 2019-07-19 DIAGNOSIS — T189XXA Foreign body of alimentary tract, part unspecified, initial encounter: Secondary | ICD-10-CM | POA: Diagnosis not present

## 2019-07-19 NOTE — Telephone Encounter (Signed)
Pt called to inform that she has not passed capsule yet and tomorrow will be 72 hours. Pt is getting concerned about it. Pls call her.

## 2019-07-19 NOTE — Telephone Encounter (Signed)
Pt does not think she has passed the capsule. She will come for an xray to see if she has passed it. Order in epic.

## 2019-07-22 ENCOUNTER — Other Ambulatory Visit: Payer: Self-pay

## 2019-07-22 DIAGNOSIS — T184XXA Foreign body in colon, initial encounter: Secondary | ICD-10-CM

## 2019-07-23 ENCOUNTER — Encounter: Payer: PPO | Admitting: Physical Therapy

## 2019-07-25 ENCOUNTER — Ambulatory Visit: Payer: PPO | Admitting: Physical Therapy

## 2019-07-25 ENCOUNTER — Ambulatory Visit (INDEPENDENT_AMBULATORY_CARE_PROVIDER_SITE_OTHER)
Admission: RE | Admit: 2019-07-25 | Discharge: 2019-07-25 | Disposition: A | Discharge status: Home / Self Care | Payer: PPO | Source: Ambulatory Visit | Attending: Internal Medicine | Admitting: Internal Medicine

## 2019-07-25 ENCOUNTER — Other Ambulatory Visit: Payer: Self-pay

## 2019-07-25 DIAGNOSIS — Z1889 Other specified retained foreign body fragments: Secondary | ICD-10-CM | POA: Diagnosis not present

## 2019-07-25 DIAGNOSIS — T184XXA Foreign body in colon, initial encounter: Secondary | ICD-10-CM | POA: Diagnosis not present

## 2019-07-30 ENCOUNTER — Encounter: Payer: PPO | Admitting: Physical Therapy

## 2019-07-30 ENCOUNTER — Telehealth: Payer: Self-pay | Admitting: Internal Medicine

## 2019-07-30 NOTE — Telephone Encounter (Signed)
Noted, Dr. Henrene Pastor notified.

## 2019-07-31 DIAGNOSIS — Z0000 Encounter for general adult medical examination without abnormal findings: Secondary | ICD-10-CM | POA: Diagnosis not present

## 2019-07-31 DIAGNOSIS — K219 Gastro-esophageal reflux disease without esophagitis: Secondary | ICD-10-CM | POA: Diagnosis not present

## 2019-07-31 DIAGNOSIS — Z13220 Encounter for screening for lipoid disorders: Secondary | ICD-10-CM | POA: Diagnosis not present

## 2019-07-31 DIAGNOSIS — R7989 Other specified abnormal findings of blood chemistry: Secondary | ICD-10-CM | POA: Diagnosis not present

## 2019-07-31 DIAGNOSIS — N951 Menopausal and female climacteric states: Secondary | ICD-10-CM | POA: Diagnosis not present

## 2019-07-31 DIAGNOSIS — F419 Anxiety disorder, unspecified: Secondary | ICD-10-CM | POA: Diagnosis not present

## 2019-07-31 DIAGNOSIS — G4700 Insomnia, unspecified: Secondary | ICD-10-CM | POA: Diagnosis not present

## 2019-07-31 NOTE — Telephone Encounter (Signed)
Please let the patient know that her capsule endoscopy did not show any significant abnormalities.  This is good.  No additional work-up or recommendations at this time.  Resume care with her primary physician.

## 2019-08-01 ENCOUNTER — Other Ambulatory Visit: Payer: Self-pay

## 2019-08-01 ENCOUNTER — Encounter: Payer: Self-pay | Admitting: Physical Therapy

## 2019-08-01 ENCOUNTER — Ambulatory Visit: Payer: PPO | Attending: Nurse Practitioner | Admitting: Physical Therapy

## 2019-08-01 DIAGNOSIS — R293 Abnormal posture: Secondary | ICD-10-CM | POA: Insufficient documentation

## 2019-08-01 DIAGNOSIS — M6281 Muscle weakness (generalized): Secondary | ICD-10-CM | POA: Diagnosis not present

## 2019-08-01 DIAGNOSIS — R252 Cramp and spasm: Secondary | ICD-10-CM | POA: Diagnosis not present

## 2019-08-01 NOTE — Telephone Encounter (Signed)
Spoke with pt and she is aware.

## 2019-08-01 NOTE — Patient Instructions (Signed)
Access Code: [email or web address] URL: [email or web address] Date: 07/16/2019 Prepared by: Jari Favre  Exercises Seated Diaphragmatic Breathing - 3 x daily - 7 x weekly - 10 reps - 1 sets Seated Lumbar Flexion Stretch - 1 x daily - 7 x weekly - 1 sets - 10 reps - 10 hold Thoracic Extension Mobilization with Noodle - 1 x daily - 7 x weekly - 1 sets - 10 reps - 10 hold Sidelying Thoracic Lumbar Rotation - 1 x daily - 7 x weekly - 5 reps - 1 sets - 10 sec hold Supine Pelvic Floor Stretch - 1 x daily - 7 x weekly - 3 reps - 1 sets - 30 sec hold Supine Piriformis Stretch - 1 x daily - 7 x weekly - 3 reps - 1 sets - 30 sec hold Bridge - 1 x daily - 7 x weekly - 2 sets - 10 reps - 5 sec hold Static Prone on Elbows - 1 x daily - 7 x weekly - 10 reps - 3 sets Supine Cervical Retraction with Towel - 1 x daily - 7 x weekly - 1 sets - 10 reps - 5 sec hold Seated Sidebending - 1 x daily - 7 x weekly - 10 reps - 3 sets Seated Thoracic Extension and Rotation with Reach - 1 x daily - 7 x weekly - 10 reps - 3 sets Sternocleidomastoid Stretch - 1 x daily - 7 x weekly - 3 reps - 1 sets - 30 sec hold Quadruped Alternating Arm Lift - 1 x daily - 7 x weekly - 10 reps - 2 sets Cat-Camel - 1 x daily - 7 x weekly - 10 reps - 3 sets Supine Bridge with Resistance Band - 1 x daily - 7 x weekly - 1 sets - 5 reps - 10 sec hold Bent Knee Fallouts - 1 x daily - 7 x weekly - 10 reps - 3 sets Supine Runner's Stretch at Wall - 1 x daily - 7 x weekly - 10 reps - 3 sets Doorway Piriformis Stretch - 1 x daily - 7 x weekly - 10 reps - 3 sets Modified Supine Hamstring Stretch with Doorway - 1 x daily - 7 x weekly - 10 reps - 3 sets

## 2019-08-01 NOTE — Therapy (Signed)
[facility] [address] [place], [place], [address] Phone: [phone]   Fax:  [phone]  Physical Therapy Treatment  Patient Details  Name: Rebecca Fernandez MRN: [id number] Date of Birth: 1955/06/04 Referring Provider (PT): Noralyn Pick, NP   Encounter Date: 08/01/2019  PT End of Session - 08/01/19 1706    Visit Number  7    Date for PT Re-Evaluation  08/22/19    PT Start Time  [id number]    PT Stop Time  1700    PT Time Calculation (min)  44 min    Activity Tolerance  Patient tolerated treatment well    Behavior During Therapy  [facility] for tasks assessed/performed       Past Medical History:  Diagnosis Date  . Kidney stones     Past Surgical History:  Procedure Laterality Date  . CATARACT EXTRACTION    . FOOT SURGERY    . KIDNEY STONE SURGERY    . LASIK    . PALATE / UVULA BIOPSY / EXCISION      There were no vitals filed for this visit.  Subjective Assessment - 08/01/19 1620    Subjective  It still feels about the same and feels like I am not emptying completely.  I had leakage yesterday, but before than it was 3-4 days    Pertinent History  partial hysterectomy, acid reflux, cyst on kidney    Patient Stated Goals  not have leakage    Currently in Pain?  No/denies                       [facility] Adult PT Treatment/Exercise - 08/01/19 0001      Lumbar Exercises: Stretches   Figure 4 Stretch  3 reps;20 seconds   on foam roller   Other Lumbar Stretch Exercise  knee to chest double and single on soft foam roller      Lumbar Exercises: Supine   Other Supine Lumbar Exercises  pec stretch on foam roller; cervical rotation on foam; shoulder abduction green band; bent knee fall outs; diagonals - all on soft foam roller with towel behind head - 30x each             PT Education - 08/01/19 1716    Education Details  Access Code: [email or web address]    Person(s) Educated  Patient    Methods   Explanation;Demonstration;Verbal cues;Handout    Comprehension  Verbalized understanding;Returned demonstration       PT Short Term Goals - 07/16/19 1642      PT SHORT TERM GOAL #1   Title  ind with intial HEP    Status  Achieved      PT SHORT TERM GOAL #2   Title  Pt will report 25% reduced leakage    Baseline  2-3 days it is better but not gone        PT Long Term Goals - 07/11/19 1653      PT LONG TERM GOAL #1   Title  Pt will report no fecal leakage    Status  On-going      PT LONG TERM GOAL #2   Title  Pt will maintain neutral pelvis for improved function of pelvic floor muscles    Status  On-going      PT LONG TERM GOAL #3   Title  Pt will have improved endurance for at least 20 sec hold of pelvic floor in order to control  bowel and bladder function and urge    Baseline  added holding time to exercises during session today    Status  On-going      PT LONG TERM GOAL #4   Title  Pt will report at least 50% less pain in tailbone when sitting due to improved sitting posture    Baseline  maybe a little better    Status  On-going      PT LONG TERM GOAL #5   Title  ind with advanced HEP    Status  On-going            Plan - 08/01/19 1708    Clinical Impression Statement  Pt reports making it 3-4 days without leakage but still having the issue and feels frustrated.  Pt did well with exercises supine on the foam roller.  She was challenged and also got a good thoracic extension and core stretch.  Pt was a little stiff when getting up from the foam roller.  Pt is recommended to continue skilled PT for improved muscle length and core and pelvic strengthening.    PT Treatment/Interventions  ADLs/Self Care Home Management;Biofeedback;Cryotherapy;Electrical Stimulation;Moist Heat;Neuromuscular re-education;Therapeutic exercise;Therapeutic activities;Patient/family education;Manual techniques;Dry needling;Passive range of motion;Taping    PT Next Visit Plan  posture and  thoracic extension; foam roll supine; side bending on ball, abdominal wall STM as needed    PT Home Exercise Plan  Access Code: [email or web address]    Consulted and Agree with Plan of Care  Patient       Patient will benefit from skilled therapeutic intervention in order to improve the following deficits and impairments:  Impaired tone, Increased fascial restricitons, Decreased range of motion, Decreased coordination, Increased muscle spasms, Pain, Postural dysfunction, Decreased strength, Decreased endurance  Visit Diagnosis: Cramp and spasm  Muscle weakness (generalized)  Abnormal posture     Problem List Patient Active Problem List   Diagnosis Date Noted  . Melena 06/14/2019  . Abdominal pain, epigastric 03/13/2019  . Renal cyst, native, hemorrhage 01/08/2018  . Palpitations 12/05/2017  . Dyspnea on exertion 12/05/2017  . Coronary artery calcification seen on CT scan 12/05/2017  . 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 PLANTAR FACIITIS 07/08/2008    Jule Ser, PT 08/01/2019, 5:21 PM  [facility] [address] [place], [place], [address] Phone: [phone]   Fax:  [phone]  Name: MADA LAZARIN MRN: [id number] Date of Birth: 01-29-1956

## 2019-08-06 ENCOUNTER — Ambulatory Visit: Payer: PPO | Admitting: Physical Therapy

## 2019-08-06 ENCOUNTER — Telehealth: Payer: Self-pay | Admitting: Physical Therapy

## 2019-08-06 NOTE — Telephone Encounter (Signed)
Called patient due to no show.  Pt had spoken to front desk and canceled but there was a miscommunication and patient was thought to be confirming her appointment.  She did not report needing to cancel her next visit.  Gustavus Bryant, PT 08/06/19 4:54 PM

## 2019-08-08 ENCOUNTER — Ambulatory Visit: Payer: PPO | Admitting: Physical Therapy

## 2019-09-03 ENCOUNTER — Other Ambulatory Visit: Payer: Self-pay

## 2019-09-03 ENCOUNTER — Ambulatory Visit: Payer: PPO | Attending: Nurse Practitioner | Admitting: Physical Therapy

## 2019-09-03 ENCOUNTER — Encounter: Payer: Self-pay | Admitting: Physical Therapy

## 2019-09-03 DIAGNOSIS — M6281 Muscle weakness (generalized): Secondary | ICD-10-CM | POA: Insufficient documentation

## 2019-09-03 DIAGNOSIS — R293 Abnormal posture: Secondary | ICD-10-CM | POA: Insufficient documentation

## 2019-09-03 DIAGNOSIS — R252 Cramp and spasm: Secondary | ICD-10-CM | POA: Diagnosis not present

## 2019-09-03 NOTE — Therapy (Addendum)
[facility] [address], [place], [place], [address] Phone: [phone]   Fax:  [phone]  Physical Therapy Treatment  Patient Details  Name: Rebecca Fernandez MRN: [id number] Date of Birth: February 02, 1956 Referring Provider (PT): Noralyn Pick, NP   Encounter Date: 09/03/2019  PT End of Session - 09/03/19 1449    Visit Number  8    Date for PT Re-Evaluation  10/29/19    PT Start Time  1450    PT Stop Time  1530    PT Time Calculation (min)  40 min       Past Medical History:  Diagnosis Date  . Kidney stones     Past Surgical History:  Procedure Laterality Date  . CATARACT EXTRACTION    . FOOT SURGERY    . KIDNEY STONE SURGERY    . LASIK    . PALATE / UVULA BIOPSY / EXCISION      There were no vitals filed for this visit.  Subjective Assessment - 09/03/19 1451    Subjective  I have leakage more recently.  Now it is about 2-3 days with leakage then 2-3 days without it.    Pertinent History  partial hysterectomy, acid reflux, cyst on kidney    Patient Stated Goals  not have leakage    Currently in Pain?  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    Referring Provider (PT) Noralyn Pick, NP                      Pelvic Floor Special Questions - 09/05/19 0001    Pelvic Floor Internal Exam pt identity confirmed and internal soft tissue assessment perfomred    Exam Type Rectal    Palpation unable to bulge and relax puborectalis    Strength fair squeeze, definite lift    Tone high             [facility] Adult PT Treatment/Exercise - 09/05/19 0001      Neuro Re-ed    Neuro Re-ed Details  tactile cues and verbal cues for breathing and bulging plelvic floor      Lumbar Exercises: Stretches   Other Lumbar Stretch Exercise thoracic extension - with towel roll and ball behind  back      Manual Therapy   Manual therapy comments pt identity confirmed and consent give to treat internal soft tissue    Soft tissue mobilization contract and relax stretches - anal sphincter and puborectalis                    PT Short Term Goals - 07/16/19 1642      PT SHORT TERM GOAL #1   Title ind with intial HEP    Status Achieved      PT SHORT TERM GOAL #2   Title Pt will report 25% reduced leakage    Baseline 2-3 days it is better but not gone             PT Long Term Goals - 09/03/19 1457      PT LONG TERM GOAL #1   Title Pt will report no fecal leakage or smearing for at least one week    Baseline was doing better and not leaking but now it is back; intial goal not met  Time 8    Period Weeks    Status Revised    Target Date 10/29/19      PT LONG TERM GOAL #2   Title Pt will maintain neutral pelvis for improved function of pelvic floor muscles    Baseline Lt anterior obliquity    Time 8    Period Weeks    Status On-going    Target Date 10/29/19      PT LONG TERM GOAL #3   Title Pt will have improved endurance for at least 10 sec hold of pelvic floor in order to control bowel and bladder function and urge    Baseline adjusted goal based on current function    Time 8    Period Weeks    Status Revised    Target Date 10/29/19      PT LONG TERM GOAL #4   Title Pt will report at least 50% less pain in tailbone when sitting due to improved sitting posture    Baseline still hurts when I sit on it, better    Time 8    Period Weeks    Status Partially Met    Target Date 10/29/19      PT LONG TERM GOAL #5   Title ind with advanced HEP    Time 8    Period Weeks    Status On-going    Target Date 10/29/19      Additional Long Term Goals   Additional Long Term Goals Yes                 Plan - 09/05/19 0935    Clinical Impression Statement Pt tolerated internal soft tissue and tactile cues.  She is having a lot of difficulty relaxing  and bulging puborectalis. Pt was educated and performed vowel sounds, blowing.  Pt got slightly improved soft tissue length with contract, relax, stretch and STM to puborectalis and anal sphincters.  Pt will definitely benefit from skilled PT to continue to work on improved sof ttissue length and muscle coordination.    PT Treatment/Interventions ADLs/Self Care Home Management;Biofeedback;Cryotherapy;Electrical Stimulation;Moist Heat;Neuromuscular re-education;Therapeutic exercise;Therapeutic activities;Patient/family education;Manual techniques;Dry needling;Passive range of motion;Taping    PT Next Visit Plan internal STM and contract/relax/bulge of puborectalis; spinal mobility    PT Home Exercise Plan Access Code: [email or web address]    Consulted and Agree with Plan of Care Patient           Patient will benefit from skilled therapeutic intervention in order to improve the following deficits and impairments:  Impaired tone, Increased fascial restricitons, Decreased range of motion, Decreased coordination, Increased muscle spasms, Pain, Postural dysfunction, Decreased strength, Decreased endurance  Visit Diagnosis: Cramp and spasm  Muscle weakness (generalized)  Abnormal posture     Problem List Patient Active Problem List   Diagnosis Date Noted  . Melena 06/14/2019  . Abdominal pain, epigastric 03/13/2019  . Renal cyst, native, hemorrhage 01/08/2018  . Palpitations 12/05/2017  . Dyspnea on exertion 12/05/2017  . Coronary artery calcification seen on CT scan 12/05/2017  . 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 PLANTAR FACIITIS 07/08/2008    Jule Ser, PT 09/05/2019, 11:50 AM  [facility] [address] [place], [place], [address] Phone: [phone]   Fax:  [phone]  Name: Rebecca Fernandez MRN: [id number] Date of Birth: [birth year]

## 2019-09-05 ENCOUNTER — Ambulatory Visit: Payer: PPO | Admitting: Physical Therapy

## 2019-09-05 NOTE — Addendum Note (Signed)
Addended by: Su Hoff on: 09/05/2019 11:51 AM   Modules accepted: Orders

## 2019-09-12 ENCOUNTER — Other Ambulatory Visit: Payer: Self-pay

## 2019-09-12 ENCOUNTER — Ambulatory Visit: Payer: PPO | Admitting: Physical Therapy

## 2019-09-12 DIAGNOSIS — M6281 Muscle weakness (generalized): Secondary | ICD-10-CM

## 2019-09-12 DIAGNOSIS — R252 Cramp and spasm: Secondary | ICD-10-CM

## 2019-09-12 DIAGNOSIS — R293 Abnormal posture: Secondary | ICD-10-CM

## 2019-09-12 NOTE — Therapy (Signed)
[facility] [address], [place], [place], [address] Phone: [phone]   Fax:  [phone]  Physical Therapy Treatment  Patient Details  Name: Rebecca Fernandez MRN: [id number] Date of Birth: Oct 15, 1955 Referring Provider (PT): Noralyn Pick, NP   Encounter Date: 09/12/2019   PT End of Session - 09/12/19 1715    Visit Number 9    Date for PT Re-Evaluation 10/29/19    PT Start Time 1615    PT Stop Time 1701    PT Time Calculation (min) 46 min    Activity Tolerance Patient tolerated treatment well    Behavior During Therapy [facility] for tasks assessed/performed           Past Medical History:  Diagnosis Date  . Kidney stones     Past Surgical History:  Procedure Laterality Date  . CATARACT EXTRACTION    . FOOT SURGERY    . KIDNEY STONE SURGERY    . LASIK    . PALATE / UVULA BIOPSY / EXCISION      There were no vitals filed for this visit.   Subjective Assessment - 09/12/19 1617    Subjective I haven't had any leakage this week.  Smearing 2x.  Stool was more formed. I was standing more for work.    Patient Stated Goals not have leakage    Currently in Pain? No/denies                             [facility] Adult PT Treatment/Exercise - 09/12/19 0001      Neuro Re-ed    Neuro Re-ed Details  tactile cues for improved posture and extension in thoracic spine throughout      Lumbar Exercises: Standing   Other Standing Lumbar Exercises pallof press red band - TC/VC for posutre - 10x each    Other Standing Lumbar Exercises diagonals red band in standing with a little rotation -15x each side      Lumbar Exercises: Quadruped   Other Quadruped Lumbar Exercises torso on red ball on table for trunk support - W and T - 10x each      Manual Therapy   Manual Therapy Soft tissue mobilization;Joint mobilization    Joint Mobilization PAs T2-12    Soft tissue mobilization upper traps,  suboccipitals; cervical fascial release                    PT Short Term Goals - 07/16/19 1642      PT SHORT TERM GOAL #1   Title ind with intial HEP    Status Achieved      PT SHORT TERM GOAL #2   Title Pt will report 25% reduced leakage    Baseline 2-3 days it is better but not gone             PT Long Term Goals - 09/12/19 1625      PT LONG TERM GOAL #1   Title Pt will report no fecal leakage or smearing for at least one week    Baseline did not this week    Status On-going      PT LONG TERM GOAL #2   Title Pt will maintain neutral pelvis for improved function of pelvic floor muscles      PT LONG TERM GOAL #3   Title Pt will have improved endurance for at least 10 sec hold of pelvic floor in  order to control bowel and bladder function and urge    Status On-going      PT LONG TERM GOAL #4   Title Pt will report at least 50% less pain in tailbone when sitting due to improved sitting posture    Status On-going      PT LONG TERM GOAL #5   Title ind with advanced HEP    Status On-going                 Plan - 09/12/19 1704    Clinical Impression Statement Pt reports improvement with smearing 2x this week and no leakage.  Pt did well with exercises for upper back strength.  She had improved thoracic mobility with joint mobs.  She was very stiff at T3-6.  Pt will benefit from skilled PT to ensure she continues to maintain improved posture and work on back extension strength    PT Treatment/Interventions ADLs/Self Care Home Management;Biofeedback;Cryotherapy;Electrical Stimulation;Moist Heat;Neuromuscular re-education;Therapeutic exercise;Therapeutic activities;Patient/family education;Manual techniques;Dry needling;Passive range of motion;Taping    PT Next Visit Plan internal STM and contract/relax/bulge of puborectalis; spinal mobility; f/u on W prone on ball and diagonals    PT Home Exercise Plan Access Code: [email or web address]    Consulted and Agree with Plan of  Care Patient           Patient will benefit from skilled therapeutic intervention in order to improve the following deficits and impairments:  Impaired tone, Increased fascial restricitons, Decreased range of motion, Decreased coordination, Increased muscle spasms, Pain, Postural dysfunction, Decreased strength, Decreased endurance  Visit Diagnosis: Cramp and spasm  Muscle weakness (generalized)  Abnormal posture     Problem List Patient Active Problem List   Diagnosis Date Noted  . Melena 06/14/2019  . Abdominal pain, epigastric 03/13/2019  . Renal cyst, native, hemorrhage 01/08/2018  . Palpitations 12/05/2017  . Dyspnea on exertion 12/05/2017  . Coronary artery calcification seen on CT scan 12/05/2017  . 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 PLANTAR FACIITIS 07/08/2008    Jule Ser, PT 09/12/2019, 5:25 PM   [facility] [address] [place], [place], [address] Phone: [phone]   Fax:  [phone]  Name: Rebecca Fernandez MRN: [id number] Date of Birth: April 08, 1955

## 2019-09-19 ENCOUNTER — Ambulatory Visit: Payer: PPO | Admitting: Physical Therapy

## 2019-09-21 ENCOUNTER — Other Ambulatory Visit: Payer: Self-pay | Admitting: Nurse Practitioner

## 2019-09-25 DIAGNOSIS — H3581 Retinal edema: Secondary | ICD-10-CM | POA: Diagnosis not present

## 2019-09-25 DIAGNOSIS — H16223 Keratoconjunctivitis sicca, not specified as Sjogren's, bilateral: Secondary | ICD-10-CM | POA: Diagnosis not present

## 2019-09-25 DIAGNOSIS — H43393 Other vitreous opacities, bilateral: Secondary | ICD-10-CM | POA: Diagnosis not present

## 2019-09-25 DIAGNOSIS — Z961 Presence of intraocular lens: Secondary | ICD-10-CM | POA: Diagnosis not present

## 2019-09-26 ENCOUNTER — Other Ambulatory Visit: Payer: Self-pay

## 2019-09-26 ENCOUNTER — Ambulatory Visit: Payer: PPO | Attending: Nurse Practitioner | Admitting: Physical Therapy

## 2019-09-26 DIAGNOSIS — M6281 Muscle weakness (generalized): Secondary | ICD-10-CM | POA: Insufficient documentation

## 2019-09-26 DIAGNOSIS — R293 Abnormal posture: Secondary | ICD-10-CM | POA: Insufficient documentation

## 2019-09-26 DIAGNOSIS — R252 Cramp and spasm: Secondary | ICD-10-CM | POA: Diagnosis not present

## 2019-09-27 ENCOUNTER — Encounter: Payer: Self-pay | Admitting: Physical Therapy

## 2019-09-27 NOTE — Therapy (Signed)
[facility] [address], [place], [place], [address] Phone: [phone]   Fax:  [phone]  Physical Therapy Treatment Progress Note Reporting Period 05/30/2019 to 09/26/2019  See note below for Objective Data and Assessment of Progress/Goals.      Patient Details  Name: Rebecca Fernandez MRN: [id number] Date of Birth: 1955-03-31 Referring Provider (PT): Noralyn Pick, NP   Encounter Date: 09/26/2019   PT End of Session - 09/26/19 1627    Visit Number 10    Date for PT Re-Evaluation 10/29/19    PT Start Time 0174    PT Stop Time 1658    PT Time Calculation (min) 41 min    Activity Tolerance Patient tolerated treatment well    Behavior During Therapy [facility] for tasks assessed/performed           Past Medical History:  Diagnosis Date  . Kidney stones     Past Surgical History:  Procedure Laterality Date  . CATARACT EXTRACTION    . FOOT SURGERY    . KIDNEY STONE SURGERY    . LASIK    . PALATE / UVULA BIOPSY / EXCISION      There were no vitals filed for this visit.   Subjective Assessment - 09/26/19 1623    Subjective I had an issue on two of the days and it felt there was more stress.  Other days I had a better bowel movement.    Pertinent History partial hysterectomy, acid reflux, cyst on kidney    Patient Stated Goals not have leakage    Currently in Pain? No/denies                             [facility] Adult PT Treatment/Exercise - 09/27/19 0001      Neuro Re-ed    Neuro Re-ed Details  VC for posture and cervical retraction      Lumbar Exercises: Seated   Other Seated Lumbar Exercises thoracic ext with ball behind; added green band for horizontal abduction and ER - 20x each   cues for posture and added cervical retraction     Manual Therapy   Joint Mobilization PAs T2-12    Soft tissue mobilization thoracic and lumbar paraspinals fascial release                     PT Short Term Goals - 07/16/19 1642      PT SHORT TERM GOAL #1   Title ind with intial HEP    Status Achieved      PT SHORT TERM GOAL #2   Title Pt will report 25% reduced leakage    Baseline 2-3 days it is better but not gone             PT Long Term Goals - 09/27/19 0820      PT LONG TERM GOAL #1   Title Pt will report no fecal leakage or smearing for at least one week    Baseline in 2 weeks, had it twice - improving    Status Partially Met      PT LONG TERM GOAL #2   Title Pt will maintain neutral pelvis for improved function of pelvic floor muscles    Baseline Lt anterior obliquity at previous assessment    Status On-going      PT LONG TERM GOAL #3   Title Pt will have improved endurance for at  least 10 sec hold of pelvic floor in order to control bowel and bladder function and urge    Status On-going      PT LONG TERM GOAL #4   Title Pt will report at least 50% less pain in tailbone when sitting due to improved sitting posture    Baseline still hurts when I sit on it, better    Status On-going      PT LONG TERM GOAL #5   Title ind with advanced HEP    Status On-going                 Plan - 09/27/19 5102    Clinical Impression Statement Pt overall making progress with over one week of improved BM and no leakage.  She had 2 episodes this week but noticed she was more stressed those days as well.  PT worked on joint mobs again today along thoracic spine and STM to thoracic and lumbar paraspinlas.  Continued working on posture and thoracic extension and she needed cues to more fully retract cervical spine when doing exercises.  pt will continue to benefit from skilled PT to work on functional goals.    PT Treatment/Interventions ADLs/Self Care Home Management;Biofeedback;Cryotherapy;Electrical Stimulation;Moist Heat;Neuromuscular re-education;Therapeutic exercise;Therapeutic activities;Patient/family education;Manual techniques;Dry  needling;Passive range of motion;Taping    PT Next Visit Plan f/u on extension exercise with cervical retraction; internal STM and contract/relax/bulge of puborectalis - work on endurance getting up to 10 sec hold of kegel    Consulted and Agree with Plan of Care Patient           Patient will benefit from skilled therapeutic intervention in order to improve the following deficits and impairments:  Impaired tone, Increased fascial restricitons, Decreased range of motion, Decreased coordination, Increased muscle spasms, Pain, Postural dysfunction, Decreased strength, Decreased endurance  Visit Diagnosis: Cramp and spasm  Muscle weakness (generalized)  Abnormal posture     Problem List Patient Active Problem List   Diagnosis Date Noted  . Melena 06/14/2019  . Abdominal pain, epigastric 03/13/2019  . Renal cyst, native, hemorrhage 01/08/2018  . Palpitations 12/05/2017  . Dyspnea on exertion 12/05/2017  . Coronary artery calcification seen on CT scan 12/05/2017  . 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 PLANTAR FACIITIS 07/08/2008   Jule Ser, PT 09/27/2019, 8:27 AM  [facility] [address], [place], [place], [address] Phone: [phone]   Fax:  [phone]  Name: Rebecca Fernandez MRN: [id number] Date of Birth: 1955/09/09

## 2019-10-08 ENCOUNTER — Encounter (INDEPENDENT_AMBULATORY_CARE_PROVIDER_SITE_OTHER): Payer: PPO | Admitting: Ophthalmology

## 2019-10-08 ENCOUNTER — Other Ambulatory Visit: Payer: Self-pay

## 2019-10-08 DIAGNOSIS — H4423 Degenerative myopia, bilateral: Secondary | ICD-10-CM

## 2019-10-08 DIAGNOSIS — D3132 Benign neoplasm of left choroid: Secondary | ICD-10-CM | POA: Diagnosis not present

## 2019-10-08 DIAGNOSIS — H43813 Vitreous degeneration, bilateral: Secondary | ICD-10-CM

## 2019-10-10 ENCOUNTER — Encounter: Payer: Self-pay | Admitting: Physical Therapy

## 2019-10-10 ENCOUNTER — Other Ambulatory Visit: Payer: Self-pay

## 2019-10-10 ENCOUNTER — Ambulatory Visit: Payer: PPO | Admitting: Physical Therapy

## 2019-10-10 DIAGNOSIS — M6281 Muscle weakness (generalized): Secondary | ICD-10-CM

## 2019-10-10 DIAGNOSIS — R252 Cramp and spasm: Secondary | ICD-10-CM

## 2019-10-10 DIAGNOSIS — R293 Abnormal posture: Secondary | ICD-10-CM

## 2019-10-10 NOTE — Therapy (Signed)
[facility] [address] [place], [place], [address] Phone: [phone]   Fax:  [phone]  Physical Therapy Treatment  Patient Details  Name: RAMONICA GRIGG MRN: [id number] Date of Birth: Sep 11, 1955 Referring Provider (PT): Noralyn Pick, NP   Encounter Date: 10/10/2019   PT End of Session - 10/10/19 1531    Visit Number 11    Date for PT Re-Evaluation 10/29/19    PT Start Time 1532    PT Stop Time 1613    PT Time Calculation (min) 41 min    Activity Tolerance Patient tolerated treatment well    Behavior During Therapy [facility] for tasks assessed/performed           Past Medical History:  Diagnosis Date  . Kidney stones     Past Surgical History:  Procedure Laterality Date  . CATARACT EXTRACTION    . FOOT SURGERY    . KIDNEY STONE SURGERY    . LASIK    . PALATE / UVULA BIOPSY / EXCISION      There were no vitals filed for this visit.   Subjective Assessment - 10/10/19 1534    Subjective This past week leakage was almost every day    Patient Stated Goals not have leakage    Currently in Pain? No/denies                          Pelvic Floor Special Questions - 10/10/19 0001    Pelvic Floor Internal Exam pt identity confirmed and internal soft tissue assessment perfomred    Exam Type Rectal    Palpation unable to bulge and relax puborectalis    Strength fair squeeze, definite lift   felt a little more like 3-/5 today   Strength # of reps --   2 in 10 sec - difficulty relaxing   Strength # of seconds 5    Tone high             [facility] Adult PT Treatment/Exercise - 10/10/19 0001      Neuro Re-ed    Neuro Re-ed Details  discussed ways to bulge better      Manual Therapy   Myofascial Release abdominal fascial release                    PT Short Term Goals - 07/16/19 1642      PT SHORT TERM GOAL #1   Title ind with intial HEP    Status Achieved      PT  SHORT TERM GOAL #2   Title Pt will report 25% reduced leakage    Baseline 2-3 days it is better but not gone             PT Long Term Goals - 09/27/19 0820      PT LONG TERM GOAL #1   Title Pt will report no fecal leakage or smearing for at least one week    Baseline in 2 weeks, had it twice - improving    Status Partially Met      PT LONG TERM GOAL #2   Title Pt will maintain neutral pelvis for improved function of pelvic floor muscles    Baseline Lt anterior obliquity at previous assessment    Status On-going      PT LONG TERM GOAL #3   Title Pt will have improved endurance for at least 10 sec hold of pelvic floor in  order to control bowel and bladder function and urge    Status On-going      PT LONG TERM GOAL #4   Title Pt will report at least 50% less pain in tailbone when sitting due to improved sitting posture    Baseline still hurts when I sit on it, better    Status On-going      PT LONG TERM GOAL #5   Title ind with advanced HEP    Status On-going                 Plan - 10/10/19 1613    Clinical Impression Statement Pt was frustrated today because she had made progress but had a setback this week.  Pt has had leakage every day.  Pt having very small BM each time and comes out even when she goes to urinate.  Pt had tension at costal attachment of rectus abdominus.  Pt seems to continue to have difficutly with pelvic floor muscle coordination but due to the ups and downs of her symptoms she is very frustrated and wondering if it could be something else.  Pt does have hard time with quick flicks and endurance of muscles as noted above and this seems to be due to high muscle tone.    PT Treatment/Interventions ADLs/Self Care Home Management;Biofeedback;Cryotherapy;Electrical Stimulation;Moist Heat;Neuromuscular re-education;Therapeutic exercise;Therapeutic activities;Patient/family education;Manual techniques;Dry needling;Passive range of motion;Taping    PT Next  Visit Plan re-assess pelvic floor - STM to relax and discuss pelvic wand    PT Home Exercise Plan Access Code: [email or web address]    Consulted and Agree with Plan of Care Patient           Patient will benefit from skilled therapeutic intervention in order to improve the following deficits and impairments:  Impaired tone, Increased fascial restricitons, Decreased range of motion, Decreased coordination, Increased muscle spasms, Pain, Postural dysfunction, Decreased strength, Decreased endurance  Visit Diagnosis: Cramp and spasm  Muscle weakness (generalized)  Abnormal posture     Problem List Patient Active Problem List   Diagnosis Date Noted  . Melena 06/14/2019  . Abdominal pain, epigastric 03/13/2019  . Renal cyst, native, hemorrhage 01/08/2018  . Palpitations 12/05/2017  . Dyspnea on exertion 12/05/2017  . Coronary artery calcification seen on CT scan 12/05/2017  . 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 PLANTAR FACIITIS 07/08/2008    Jule Ser, PT 10/10/2019, 4:47 PM  [facility] [address] [place], [place], [address] Phone: [phone]   Fax:  [phone]  Name: HAFSAH HENDLER MRN: [id number] Date of Birth: [birth year]

## 2019-10-16 ENCOUNTER — Other Ambulatory Visit: Payer: Self-pay

## 2019-10-16 ENCOUNTER — Ambulatory Visit: Payer: PPO | Admitting: Physical Therapy

## 2019-10-16 ENCOUNTER — Encounter: Payer: Self-pay | Admitting: Physical Therapy

## 2019-10-16 DIAGNOSIS — M6281 Muscle weakness (generalized): Secondary | ICD-10-CM

## 2019-10-16 DIAGNOSIS — R252 Cramp and spasm: Secondary | ICD-10-CM

## 2019-10-16 DIAGNOSIS — R293 Abnormal posture: Secondary | ICD-10-CM

## 2019-10-16 NOTE — Therapy (Signed)
[facility] [address] [place], [place], [address] Phone: [phone]   Fax:  [phone]  Physical Therapy Treatment  Patient Details  Name: Rebecca Fernandez MRN: [id number] Date of Birth: 1955/04/10 Referring Provider (PT): Noralyn Pick, NP   Encounter Date: 10/16/2019   PT End of Session - 10/16/19 1115    Visit Number 12    Date for PT Re-Evaluation 10/29/19    PT Start Time 0932    PT Stop Time 1012    PT Time Calculation (min) 40 min    Activity Tolerance Patient tolerated treatment well    Behavior During Therapy [facility] for tasks assessed/performed           Past Medical History:  Diagnosis Date  . Kidney stones     Past Surgical History:  Procedure Laterality Date  . CATARACT EXTRACTION    . FOOT SURGERY    . KIDNEY STONE SURGERY    . LASIK    . PALATE / UVULA BIOPSY / EXCISION      There were no vitals filed for this visit.   Subjective Assessment - 10/16/19 0936    Subjective Better this week.  There is something there when I wipe sometimes, but better and more formed BMs.    Patient Stated Goals not have leakage    Currently in Pain? No/denies                          Pelvic Floor Special Questions - 10/16/19 0001    Pelvic Floor Internal Exam pt identity confirmed and internal soft tissue assessment perfomred and treatment given             [facility] Adult PT Treatment/Exercise - 10/16/19 0001      Manual Therapy   Manual Therapy Internal Pelvic Floor    Manual therapy comments pt identity confirmed and consent give to treat internal soft tissue    Joint Mobilization PAs T2-12    Soft tissue mobilization thoracic and lumbar paraspinals fascial release    Internal Pelvic Floor puborectalis and coccyx extension mobs                    PT Short Term Goals - 07/16/19 1642      PT SHORT TERM GOAL #1   Title ind with intial HEP    Status Achieved       PT SHORT TERM GOAL #2   Title Pt will report 25% reduced leakage    Baseline 2-3 days it is better but not gone             PT Long Term Goals - 09/27/19 0820      PT LONG TERM GOAL #1   Title Pt will report no fecal leakage or smearing for at least one week    Baseline in 2 weeks, had it twice - improving    Status Partially Met      PT LONG TERM GOAL #2   Title Pt will maintain neutral pelvis for improved function of pelvic floor muscles    Baseline Lt anterior obliquity at previous assessment    Status On-going      PT LONG TERM GOAL #3   Title Pt will have improved endurance for at least 10 sec hold of pelvic floor in order to control bowel and bladder function and urge    Status On-going      PT  LONG TERM GOAL #4   Title Pt will report at least 50% less pain in tailbone when sitting due to improved sitting posture    Baseline still hurts when I sit on it, better    Status On-going      PT LONG TERM GOAL #5   Title ind with advanced HEP    Status On-going                 Plan - 10/16/19 1117    Clinical Impression Statement Pt had much better tone today.  She has been doing less kegels and focus on stretching more this week.  Pt had reduced symptoms.  PT focused on soft tissue release to work out tension for greater efficiency of stretches.  Pt has one  visit in this POC and may be ready to discharge as long as no setbacks arise.    PT Treatment/Interventions ADLs/Self Care Home Management;Biofeedback;Cryotherapy;Electrical Stimulation;Moist Heat;Neuromuscular re-education;Therapeutic exercise;Therapeutic activities;Patient/family education;Manual techniques;Dry needling;Passive range of motion;Taping    PT Next Visit Plan review extension exercises; discuss pelvic wand if patient open to using; review all goals possible discharge    PT Home Exercise Plan Access Code: [email or web address]    Consulted and Agree with Plan of Care Patient           Patient will  benefit from skilled therapeutic intervention in order to improve the following deficits and impairments:  Impaired tone, Increased fascial restricitons, Decreased range of motion, Decreased coordination, Increased muscle spasms, Pain, Postural dysfunction, Decreased strength, Decreased endurance  Visit Diagnosis: Cramp and spasm  Muscle weakness (generalized)  Abnormal posture     Problem List Patient Active Problem List   Diagnosis Date Noted  . Melena 06/14/2019  . Abdominal pain, epigastric 03/13/2019  . Renal cyst, native, hemorrhage 01/08/2018  . Palpitations 12/05/2017  . Dyspnea on exertion 12/05/2017  . Coronary artery calcification seen on CT scan 12/05/2017  . 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 PLANTAR FACIITIS 07/08/2008    Jule Ser, PT 10/16/2019, 11:21 AM  [facility] [address], [place], [place], [address] Phone: [phone]   Fax:  [phone]  Name: Rebecca Fernandez MRN: [id number] Date of Birth: [birth year]

## 2019-10-17 ENCOUNTER — Ambulatory Visit: Payer: PPO | Admitting: Physical Therapy

## 2019-10-24 ENCOUNTER — Other Ambulatory Visit: Payer: Self-pay

## 2019-10-24 ENCOUNTER — Ambulatory Visit: Payer: PPO | Admitting: Physical Therapy

## 2019-10-24 DIAGNOSIS — R293 Abnormal posture: Secondary | ICD-10-CM

## 2019-10-24 DIAGNOSIS — R252 Cramp and spasm: Secondary | ICD-10-CM | POA: Diagnosis not present

## 2019-10-24 DIAGNOSIS — M6281 Muscle weakness (generalized): Secondary | ICD-10-CM

## 2019-10-24 NOTE — Therapy (Signed)
[facility] [address], [place], [place], [address] Phone: [phone]   Fax:  [phone]  Physical Therapy Treatment  Patient Details  Name: Rebecca Fernandez MRN: [id number] Date of Birth: [birth year] Referring Provider (PT): Noralyn Pick, NP   Encounter Date: 10/24/2019   PT End of Session - 10/24/19 1630    Visit Number 13    Date for PT Re-Evaluation 10/29/19    PT Start Time 1533    PT Stop Time 1612    PT Time Calculation (min) 39 min    Activity Tolerance Patient tolerated treatment well    Behavior During Therapy [facility] for tasks assessed/performed           Past Medical History:  Diagnosis Date  . Kidney stones     Past Surgical History:  Procedure Laterality Date  . CATARACT EXTRACTION    . FOOT SURGERY    . KIDNEY STONE SURGERY    . LASIK    . PALATE / UVULA BIOPSY / EXCISION      There were no vitals filed for this visit.   Subjective Assessment - 10/25/19 0836    Subjective I have had no problems this week at all.  I think I am better finally.    Currently in Pain? No/denies                             [facility] Adult PT Treatment/Exercise - 10/25/19 0001      Self-Care   Other Self-Care Comments  review of HEP; discussed pelvic wand and demo how to use for msucle release at home to continue      Manual Therapy   Soft tissue mobilization thoracic and lumbar paraspinals fascial release                    PT Short Term Goals - 07/16/19 1642      PT SHORT TERM GOAL #1   Title ind with intial HEP    Status Achieved      PT SHORT TERM GOAL #2   Title Pt will report 25% reduced leakage    Baseline 2-3 days it is better but not gone             PT Long Term Goals - 10/24/19 1543      PT LONG TERM GOAL #1   Title Pt will report no fecal leakage or smearing for at least one week    Status Achieved      PT LONG TERM GOAL #2   Title Pt will  maintain neutral pelvis for improved function of pelvic floor muscles    Baseline normal    Status Achieved      PT LONG TERM GOAL #3   Title Pt will have improved endurance for at least 10 sec hold of pelvic floor in order to control bowel and bladder function and urge    Status Partially Met      PT LONG TERM GOAL #4   Title Pt will report at least 50% less pain in tailbone when sitting due to improved sitting posture    Baseline no pain    Status Achieved      PT LONG TERM GOAL #5   Title ind with advanced HEP    Status Achieved                 Plan - 10/25/19  0828    Clinical Impression Statement Pt has met all goals.  At this time she is comfortable with HEP and has reviewed how to use pelvic massage tool to release tension in the pelvic floor as needed.  Pt recommended to discharge from PT today.    PT Treatment/Interventions ADLs/Self Care Home Management;Biofeedback;Cryotherapy;Electrical Stimulation;Moist Heat;Neuromuscular re-education;Therapeutic exercise;Therapeutic activities;Patient/family education;Manual techniques;Dry needling;Passive range of motion;Taping    PT Next Visit Plan d/c    PT Home Exercise Plan Access Code: [email or web address]    Consulted and Agree with Plan of Care Patient           Patient will benefit from skilled therapeutic intervention in order to improve the following deficits and impairments:  Impaired tone, Increased fascial restricitons, Decreased range of motion, Decreased coordination, Increased muscle spasms, Pain, Postural dysfunction, Decreased strength, Decreased endurance  Visit Diagnosis: Cramp and spasm  Muscle weakness (generalized)  Abnormal posture     Problem List Patient Active Problem List   Diagnosis Date Noted  . Melena 06/14/2019  . Abdominal pain, epigastric 03/13/2019  . Renal cyst, native, hemorrhage 01/08/2018  . Palpitations 12/05/2017  . Dyspnea on exertion 12/05/2017  . Coronary artery calcification seen  on CT scan 12/05/2017  . 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 PLANTAR FACIITIS 07/08/2008    Jule Ser, PT 10/25/2019, 8:40 AM  [facility] [address], [place], [place], [address] Phone: [phone]   Fax:  [phone]  Name: Rebecca Fernandez MRN: [id number] Date of Birth: January 21, 1956  PHYSICAL THERAPY DISCHARGE SUMMARY  Visits from Start of Care: 13  Current functional level related to goals / functional outcomes: See above goals   Remaining deficits: See above details   Education / Equipment: HEP  Plan: Patient agrees to discharge.  Patient goals were partially met. Patient is being discharged due to being pleased with the current functional level.  ?????    [employer], PT 10/25/19 8:40 AM

## 2019-10-31 ENCOUNTER — Encounter: Payer: PPO | Admitting: Physical Therapy

## 2019-11-22 ENCOUNTER — Ambulatory Visit: Payer: PPO | Admitting: Physical Therapy

## 2020-01-29 DIAGNOSIS — Z23 Encounter for immunization: Secondary | ICD-10-CM | POA: Diagnosis not present

## 2020-01-29 DIAGNOSIS — G4700 Insomnia, unspecified: Secondary | ICD-10-CM | POA: Diagnosis not present

## 2020-01-29 DIAGNOSIS — N951 Menopausal and female climacteric states: Secondary | ICD-10-CM | POA: Diagnosis not present

## 2020-01-29 DIAGNOSIS — K582 Mixed irritable bowel syndrome: Secondary | ICD-10-CM | POA: Diagnosis not present

## 2020-01-29 DIAGNOSIS — F419 Anxiety disorder, unspecified: Secondary | ICD-10-CM | POA: Diagnosis not present

## 2020-01-29 DIAGNOSIS — R159 Full incontinence of feces: Secondary | ICD-10-CM | POA: Diagnosis not present

## 2020-01-29 DIAGNOSIS — K219 Gastro-esophageal reflux disease without esophagitis: Secondary | ICD-10-CM | POA: Diagnosis not present

## 2020-07-17 DIAGNOSIS — Z9889 Other specified postprocedural states: Secondary | ICD-10-CM | POA: Insufficient documentation
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
## 2021-08-16 ENCOUNTER — Emergency Department (HOSPITAL_COMMUNITY)
Admission: EM | Admit: 2021-08-16 | Discharge: 2021-08-16 | Disposition: A | Discharge status: Home / Self Care | Payer: Medicare HMO | Attending: Emergency Medicine | Admitting: Emergency Medicine

## 2021-08-16 ENCOUNTER — Encounter (HOSPITAL_COMMUNITY): Payer: Self-pay

## 2021-08-16 ENCOUNTER — Emergency Department (HOSPITAL_COMMUNITY): Payer: Medicare HMO

## 2021-08-16 ENCOUNTER — Other Ambulatory Visit: Payer: Self-pay

## 2021-08-16 DIAGNOSIS — Y92410 Unspecified street and highway as the place of occurrence of the external cause: Secondary | ICD-10-CM | POA: Insufficient documentation

## 2021-08-16 DIAGNOSIS — R519 Headache, unspecified: Secondary | ICD-10-CM | POA: Diagnosis not present

## 2021-08-16 DIAGNOSIS — R0789 Other chest pain: Secondary | ICD-10-CM | POA: Diagnosis not present

## 2021-08-16 DIAGNOSIS — S169XXA Unspecified injury of muscle, fascia and tendon at neck level, initial encounter: Secondary | ICD-10-CM | POA: Diagnosis present

## 2021-08-16 DIAGNOSIS — M546 Pain in thoracic spine: Secondary | ICD-10-CM | POA: Insufficient documentation

## 2021-08-16 DIAGNOSIS — M7918 Myalgia, other site: Secondary | ICD-10-CM

## 2021-08-16 DIAGNOSIS — S161XXA Strain of muscle, fascia and tendon at neck level, initial encounter: Secondary | ICD-10-CM | POA: Diagnosis not present

## 2021-08-16 DIAGNOSIS — M545 Low back pain, unspecified: Secondary | ICD-10-CM | POA: Diagnosis not present

## 2021-08-16 MED ORDER — METHOCARBAMOL 500 MG PO TABS
500.0000 mg | ORAL_TABLET | Freq: Once | ORAL | Status: AC
  Administered 2021-08-16: 500 mg via ORAL
  Filled 2021-08-16: qty 1

## 2021-08-16 MED ORDER — KETOROLAC TROMETHAMINE 30 MG/ML IJ SOLN
30.0000 mg | Freq: Once | INTRAMUSCULAR | Status: AC
  Administered 2021-08-16: 30 mg via INTRAMUSCULAR
  Filled 2021-08-16: qty 1

## 2021-08-16 MED ORDER — METHOCARBAMOL 500 MG PO TABS: 500.0000 mg | ORAL_TABLET | Freq: Four times a day (QID) | ORAL | 0 refills | Status: AC | PRN

## 2021-08-16 MED ORDER — IBUPROFEN 600 MG PO TABS: 600.0000 mg | ORAL_TABLET | Freq: Four times a day (QID) | ORAL | 0 refills | Status: DC | PRN

## 2021-08-16 NOTE — ED Notes (Signed)
Verbal consent for MSE, no signature pad available.
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
## 2021-08-16 NOTE — ED Notes (Signed)
Patient to CT at this time

## 2022-06-09 DIAGNOSIS — H903 Sensorineural hearing loss, bilateral: Secondary | ICD-10-CM | POA: Insufficient documentation

## 2022-09-13 DIAGNOSIS — M545 Low back pain, unspecified: Secondary | ICD-10-CM | POA: Insufficient documentation

## 2022-09-30 ENCOUNTER — Other Ambulatory Visit: Payer: Self-pay

## 2022-09-30 ENCOUNTER — Other Ambulatory Visit: Payer: Self-pay | Admitting: Family Medicine

## 2022-09-30 DIAGNOSIS — M545 Low back pain, unspecified: Secondary | ICD-10-CM

## 2022-10-05 DIAGNOSIS — M5416 Radiculopathy, lumbar region: Secondary | ICD-10-CM | POA: Insufficient documentation

## 2022-12-28 ENCOUNTER — Other Ambulatory Visit: Payer: Self-pay | Admitting: Anesthesiology

## 2022-12-28 DIAGNOSIS — M5416 Radiculopathy, lumbar region: Secondary | ICD-10-CM

## 2022-12-30 ENCOUNTER — Ambulatory Visit
Admission: RE | Admit: 2022-12-30 | Discharge: 2022-12-30 | Disposition: A | Discharge status: Home / Self Care | Payer: Medicare HMO | Source: Ambulatory Visit | Attending: Anesthesiology | Admitting: Anesthesiology

## 2022-12-30 DIAGNOSIS — M5416 Radiculopathy, lumbar region: Secondary | ICD-10-CM

## 2023-02-03 DIAGNOSIS — K5904 Chronic idiopathic constipation: Secondary | ICD-10-CM | POA: Insufficient documentation

## 2023-02-03 NOTE — Progress Notes (Deleted)
Name: Rebecca Fernandez DOB: 1955-05-20 MRN: [id number]  History of Present Illness: Rebecca Fernandez is a 67 y.o. female who presents today as a new patient at [facility] [place]. All available relevant medical records have been reviewed.   She reports chief complaint of left renal cyst.  Per chart review she has a left renal cyst which has been progressively decreasing in size: - 01/17/2019: On CT abdomen/pelvis w/ contrast the left lower pole cyst measured 4.4 cm . - 03/18/2019: On abdominal UA the left lower pole cyst measured 4.1 cm and "similar findings noted dated back to CT of 09/13/2010." - 12/30/2022: On renal US the left lower pole cyst measured 3.3 cm.  Her renal function was normal on 08/08/2022 (creatinine 0.78, GFR 83).  Today: She {Actions; denies-reports:120008} flank pain or abdominal pain. She {Actions; denies-reports:120008} fevers, nausea, or vomiting.  She {Actions; denies-reports:120008} increased urinary urgency, frequency, nocturia, dysuria, gross hematuria, hesitancy, straining to void, or sensations of incomplete emptying.  ***Patient reassured of benign nature and progressively decreasing size of the left lower pole cyst. No follow up needed.  Fall Screening: Do you usually have a device to assist in your mobility? {yes/no:20286} ***cane / ***walker / ***wheelchair  Medications: Current Outpatient Medications  Medication Sig Dispense Refill   buPROPion (WELLBUTRIN XL) 300 MG 24 hr tablet Take 1 tablet by mouth daily.     clonazePAM (KLONOPIN) 1 MG tablet Take 1 mg by mouth 2 (two) times daily.     dicyclomine (BENTYL) 10 MG capsule Take 1 capsule (10 mg total) by mouth every 8 (eight) hours as needed for spasms. 30 capsule 0   estradiol (ESTRACE) 0.5 MG tablet Take 0.5 mg by mouth daily.      famotidine (PEPCID) 40 MG tablet TAKE (1) TABLET BY MOUTH ONCE DAILY. 30 tablet 0   ibuprofen (ADVIL) 600 MG tablet Take 1 tablet (600 mg total) by mouth every 6  (six) hours as needed. 30 tablet 0   methocarbamol (ROBAXIN) 500 MG tablet Take 1 tablet (500 mg total) by mouth every 6 (six) hours as needed for muscle spasms (muscle pain). 20 tablet 0   pantoprazole (PROTONIX) 40 MG tablet Take 1 tablet by mouth daily.     VITAMIN A PO Take 1 tablet by mouth 2 (two) times daily.     XIIDRA 5 % SOLN Place 1 drop into both eyes 2 (two) times daily.     zolpidem (AMBIEN) 10 MG tablet Take 10 mg by mouth at bedtime as needed.      No current facility-administered medications for this visit.   Facility-Administered Medications Ordered in Other Visits  Medication Dose Route Frequency Provider Last Rate Last Admin   technetium pyrophosphate Tc 51m injection 8.8 millicurie  8.8 millicurie Intravenous Once Laurey Morale, MD        Allergies: No Known Allergies  Past Medical History:  Diagnosis Date   Kidney stones    Past Surgical History:  Procedure Laterality Date   CATARACT EXTRACTION     FOOT SURGERY     KIDNEY STONE SURGERY     LASIK     PALATE / UVULA BIOPSY / EXCISION     Family History  Problem Relation Age of Onset   Heart disease Other    Arthritis Other    Lung disease Other    Cancer Other    Social History   Socioeconomic History   Marital status: Married    Spouse name: Not on  file   Number of children: Not on file   Years of education: Not on file   Highest education level: Not on file  Occupational History   Not on file  Tobacco Use   Smoking status: Never   Smokeless tobacco: Never  Substance and Sexual Activity   Alcohol use: No    Comment: occasionally   Drug use: No   Sexual activity: Not on file  Other Topics Concern   Not on file  Social History Narrative   Not on file   Social Determinants of Health   Financial Resource Strain: Patient Declined (07/28/2021)   [facility], [facility] visits prior to 05/28/2022., [facility] visits prior to  05/28/2022., [facility]   Overall Financial Resource Strain (CARDIA)    Difficulty of Paying Living Expenses: Patient declined  Food Insecurity: Low Risk  (08/01/2022)   [facility]   Hunger Vital Sign    Worried About Running Out of Food in the Last Year: Never true    Ran Out of Food in the Last Year: Never true  Transportation Needs: No Transportation Needs (08/01/2022)   [facility]   Transportation    In the past 12 months, has lack of reliable transportation kept you from medical appointments, meetings, work or from getting things needed for daily living? : No  Physical Activity: Inactive (07/28/2021)   [facility], [facility] prior to 05/28/2022., [facility] visits prior to 05/28/2022., [facility]   Exercise Vital Sign    Days of Exercise per Week: 0 days    Minutes of Exercise per Session: 0 min  Stress: Stress Concern Present (07/28/2021)   [facility], [facility] prior to 05/28/2022., [facility] visits prior to 05/28/2022[facility]   [employer] of Occupational Health - Occupational Stress Questionnaire    Feeling of Stress : To some extent  Social Connections: Unknown (07/28/2021)   Received from [facility], [facility] prior to 05/28/2022., [facility] visits prior to 05/28/2022., [facility]   Social Connection and Isolation Panel [NHANES]    Frequency of Communication with Friends and Family: More than three times a week    Frequency of Social Gatherings with Friends and Family: Twice a week    Attends Religious Services: Patient declined    Database administrator or Organizations: No    Attends Banker Meetings: Patient declined    Marital Status: Married  Catering manager Violence: Not At Risk (07/28/2021)    [facility] prior to 05/28/2022., [facility] visits prior to 05/28/2022.   Humiliation, Afraid, Rape, and Kick questionnaire    Fear of Current or Ex-Partner: No    Emotionally Abused: No    Physically Abused: No    Sexually Abused: No    SUBJECTIVE  Review of Systems*** Constitutional: Patient denies any unintentional weight loss or change in strength lntegumentary: Patient denies any rashes or pruritus Eyes: Patient {Actions; denies-reports:120008} dry eyes ENT: Patient {Actions; denies-reports:120008} dry mouth Cardiovascular: Patient denies chest pain or syncope Respiratory: Patient denies shortness of breath Gastrointestinal: Patient ***denies nausea, vomiting, constipation, or diarrhea Musculoskeletal: Patient denies muscle cramps or weakness Neurologic: Patient denies convulsions or seizures Allergic/Immunologic: Patient denies recent allergic  reaction(s) Hematologic/Lymphatic: Patient denies bleeding tendencies Endocrine: Patient denies heat/cold intolerance  GU: As per HPI.  OBJECTIVE There were no vitals filed for this visit. There is no height or weight on file to calculate BMI.  Physical Examination*** Constitutional: No obvious distress; patient is non-toxic appearing  Cardiovascular: No visible lower extremity edema.  Respiratory: The patient does not have audible wheezing/stridor; respirations do not appear labored  Gastrointestinal: Abdomen non-distended Musculoskeletal: Normal ROM of UEs  Skin: No obvious rashes/open sores  Neurologic: CN 2-12 grossly intact Psychiatric: Answered questions appropriately with normal affect  Hematologic/Lymphatic/Immunologic: No obvious bruises or sites of spontaneous bleeding  UA: ***negative *** WBC/hpf, *** RBC/hpf, bacteria (***) PVR: *** ml  ASSESSMENT No diagnosis found. ***  Will plan for follow up in *** months or sooner if needed. Pt verbalized  understanding and agreement. All questions were answered.  PLAN Advised the following: *** ***No follow-ups on file.  No orders of the defined types were placed in this encounter.   It has been explained that the patient is to follow regularly with their PCP in addition to all other providers involved in their care and to follow instructions provided by these respective offices. Patient advised to contact urology clinic if any urologic-pertaining questions, concerns, new symptoms or problems arise in the interim period.  There are no Patient Instructions on file for this visit.  Electronically signed by:  Donnita Falls, MSN, FNP-C, CUNP 02/03/2023 11:56 AM

## 2023-02-08 ENCOUNTER — Ambulatory Visit: Payer: PPO | Admitting: Urology

## 2023-02-08 DIAGNOSIS — N281 Cyst of kidney, acquired: Secondary | ICD-10-CM

## 2023-02-21 DIAGNOSIS — N281 Cyst of kidney, acquired: Secondary | ICD-10-CM | POA: Insufficient documentation

## 2023-02-21 NOTE — Progress Notes (Signed)
Name: Rebecca Fernandez DOB: 01/22/1956 MRN: [id number]  History of Present Illness: Rebecca Fernandez is a 67 y.o. female who presents today as a new patient at [facility]. All available relevant medical records have been reviewed.   She reports chief complaint of left renal cyst.  Per chart review she has a left renal cyst which has been progressively decreasing in size: - 01/17/2019: CT abdomen/pelvis w/ contrast showed bilateral renal cysts including a 4.4 cm left lower pole cyst; also a 3 mm left lower pole kidneys stone. - 03/18/2019: On abdominal UA the left lower pole cyst measured 4.1 cm and "similar findings noted dated back to CT of 09/13/2010." - 12/30/2022: On renal US the left lower pole cyst measured 3.3 cm. No stones or hydronephrosis noted.  Her renal function was normal on 08/08/2022 (creatinine 0.78, GFR 83).  Today: She reports intermittent LUQ pain which radiates to left flank since 2019. When present, the pain is described as feeling "deep inside", sharp, severe; typically lasts for at least 15 minutes at a time. The pain occurs both at rest and with physical activity. She states heating pad and laying supine sometimes helps. She was previously scheduled with Dr. Marlou Porch for robotic-assisted left partial nephrectomy on 03/26/2018, which was canceled because it was felt to be unnecessary. States she has had a thorough GI workup which has not found anything to explain the pain.   She denies history of recent or recurrent UTI. Today she denies dysuria, gross hematuria, hesitancy, straining to void, or sensations of incomplete emptying.  She reports baseline urinary urgency and frequency. Reports that she drinks quite a bit of coffee daily.  She reports history of kidney stones. Had surgery in the early 1980s for open stone removal from right distal ureter.   Fall Screening: Do you usually have a device to assist in your mobility? No   Medications: Current  Outpatient Medications  Medication Sig Dispense Refill   acetaminophen (TYLENOL) 650 MG CR tablet Take 650 mg by mouth every 8 (eight) hours as needed for pain.     buPROPion (WELLBUTRIN XL) 300 MG 24 hr tablet Take 1 tablet by mouth daily.     clonazePAM (KLONOPIN) 1 MG tablet Take 1 mg by mouth 2 (two) times daily.     dicyclomine (BENTYL) 10 MG capsule Take 1 capsule (10 mg total) by mouth every 8 (eight) hours as needed for spasms. 30 capsule 0   estradiol (ESTRACE) 0.5 MG tablet Take 0.5 mg by mouth daily.      famotidine (PEPCID) 40 MG tablet TAKE (1) TABLET BY MOUTH ONCE DAILY. 30 tablet 0   methocarbamol (ROBAXIN) 500 MG tablet Take 1 tablet (500 mg total) by mouth every 6 (six) hours as needed for muscle spasms (muscle pain). 20 tablet 0   pantoprazole (PROTONIX) 40 MG tablet Take 1 tablet by mouth daily.     sertraline (ZOLOFT) 25 MG tablet Take 25 mg by mouth daily.     VITAMIN A PO Take 1 tablet by mouth 2 (two) times daily.     XIIDRA 5 % SOLN Place 1 drop into both eyes 2 (two) times daily.     No current facility-administered medications for this visit.   Facility-Administered Medications Ordered in Other Visits  Medication Dose Route Frequency Provider Last Rate Last Admin   technetium pyrophosphate Tc 65m injection 8.8 millicurie  8.8 millicurie Intravenous Once Laurey Morale, MD        Allergies:  No Known Allergies  Past Medical History:  Diagnosis Date   Kidney stones    Past Surgical History:  Procedure Laterality Date   CATARACT EXTRACTION     FOOT SURGERY     KIDNEY STONE SURGERY     LASIK     PALATE / UVULA BIOPSY / EXCISION     Family History  Problem Relation Age of Onset   Heart disease Other    Arthritis Other    Lung disease Other    Cancer Other    Social History   Socioeconomic History   Marital status: Married    Spouse name: Not on file   Number of children: Not on file   Years of education: Not on file   Highest education level:  Not on file  Occupational History   Not on file  Tobacco Use   Smoking status: Never   Smokeless tobacco: Never  Substance and Sexual Activity   Alcohol use: No    Comment: occasionally   Drug use: No   Sexual activity: Not on file  Other Topics Concern   Not on file  Social History Narrative   Not on file   Social Determinants of Health   Financial Resource Strain: Patient Declined (07/28/2021)   [facility], [facility] prior to 05/28/2022., [facility] visits prior to 05/28/2022., [facility]   Overall Financial Resource Strain (CARDIA)    Difficulty of Paying Living Expenses: Patient declined  Food Insecurity: Low Risk  (08/01/2022)   [facility]   Hunger Vital Sign    Worried About Running Out of Food in the Last Year: Never true    Ran Out of Food in the Last Year: Never true  Transportation Needs: No Transportation Needs (08/01/2022)   [facility]   Transportation    In the past 12 months, has lack of reliable transportation kept you from medical appointments, meetings, work or from getting things needed for daily living? : No  Physical Activity: Inactive (07/28/2021)   [facility], [facility] visits prior to 05/28/2022., [facility] visits prior to 05/28/2022., [facility]   Exercise Vital Sign    Days of Exercise per Week: 0 days    Minutes of Exercise per Session: 0 min  Stress: Stress Concern Present (07/28/2021)   [facility] visits prior to 05/28/2022., [facility] visits prior to 05/28/2022., [facility]   [employer] of Occupational Health - Occupational Stress Questionnaire    Feeling of Stress : To some extent  Social Connections: Unknown (07/28/2021)   [facility], [facility] prior to 05/28/2022., [facility] visits prior to 05/28/2022., [facility]   Social Connection and Isolation Panel [NHANES]    Frequency of Communication with Friends and Family: More than three times a week    Frequency of Social Gatherings with Friends and Family: Twice a week    Attends Religious Services: Patient declined    Database administrator or Organizations: No    Attends Banker Meetings: Patient declined    Marital Status: Married  Catering manager Violence: Not At Risk (07/28/2021)   [facility] visits prior to 05/28/2022., [facility] visits prior to 05/28/2022.  Humiliation, Afraid, Rape, and Kick questionnaire    Fear of Current or Ex-Partner: No    Emotionally Abused: No    Physically Abused: No    Sexually Abused: No    SUBJECTIVE  Review of Systems Constitutional: Patient denies any unintentional weight loss or change in strength lntegumentary: Patient denies any rashes or pruritus Cardiovascular: Patient denies chest pain or syncope Respiratory: Patient denies shortness of breath Gastrointestinal: Patient reports intermittent diarrhea; denies nausea, vomiting, constipation Musculoskeletal: Patient denies muscle cramps or weakness Neurologic: Patient denies convulsions or seizures Allergic/Immunologic: Patient denies recent allergic reaction(s) Hematologic/Lymphatic: Patient denies bleeding tendencies Endocrine: Patient denies heat/cold intolerance  GU: As per HPI.  OBJECTIVE Vitals:   02/28/23 1129  BP: 108/69  Pulse: 65  Temp: 97.7 F (36.5 C)   There is no height or weight on file to calculate BMI.  Physical Examination Constitutional: No obvious distress; patient is non-toxic appearing  Cardiovascular: No visible lower extremity edema.  Respiratory: The patient does not have audible wheezing/stridor; respirations do not appear labored   Gastrointestinal: Abdomen non-distended Musculoskeletal: Normal ROM of UEs  Skin: No obvious rashes/open sores  Neurologic: CN 2-12 grossly intact Psychiatric: Answered questions appropriately with normal affect  Hematologic/Lymphatic/Immunologic: No obvious bruises or sites of spontaneous bleeding  UA: negative urine microscopy  ASSESSMENT Renal cyst, left - Plan: Urinalysis, Routine w reflex microscopic, MR ABDOMEN W WO CONTRAST  Chronic LUQ pain - Plan: MR ABDOMEN W WO CONTRAST  Chronic left flank pain - Plan: MR ABDOMEN W WO CONTRAST  Bilateral renal cysts - Plan: MR ABDOMEN W WO CONTRAST  Urinary urgency  Urinary frequency  Kidney stones  1. Renal cysts + chronic LUQ / left flank pain: Discussed low risk for malignancy related to renal cysts. Based on small / decreasing size of the left renal cyst she was advised that it is unlikely to be the etiology for her chronic LUQ and left flank pain, however due to the chronicity of her symptoms and indeterminate evaluation of the left renal cyst on recent RUS we agreed to obtain MRI abdomen w/wo contrast for further evaluation.   2. Kidney stones. No stones per recent RUS in October 2024.  3. Urinary urgency and frequency. Advised to decrease caffeine intake.  Will notify patient of recommendations based on imaging results. For now will plan for follow up in 6 months or sooner if needed. Pt verbalized understanding and agreement. All questions were answered.  PLAN Advised the following: MRI abdomen w/wo contrast.  Decrease caffeine intake. Return in about 6 months (around 08/29/2023) for UA, PVR, & f/u with Evette Georges NP.  Orders Placed This Encounter  Procedures   MR ABDOMEN W WO CONTRAST    Standing Status:   Future    Standing Expiration Date:   02/28/2024    Order Specific Question:   If indicated for the ordered procedure, I authorize the administration of contrast media per Radiology protocol    Answer:   Yes     Order Specific Question:   What is the patient's sedation requirement?    Answer:   No Sedation    Order Specific Question:   Does the patient have a pacemaker or implanted devices?    Answer:   No    Order Specific Question:   Preferred imaging location?    Answer:   [facility] (table limit [phone])   Urinalysis, Routine w reflex microscopic    It has been explained that the patient is  to follow regularly with their PCP in addition to all other providers involved in their care and to follow instructions provided by these respective offices. Patient advised to contact urology clinic if any urologic-pertaining questions, concerns, new symptoms or problems arise in the interim period.  There are no Patient Instructions on file for this visit.  Electronically signed by:  Donnita Falls, MSN, FNP-C, CUNP 02/28/2023 12:05 PM

## 2023-02-28 ENCOUNTER — Ambulatory Visit: Payer: Medicare HMO | Admitting: Urology

## 2023-02-28 ENCOUNTER — Encounter: Payer: Self-pay | Admitting: Urology

## 2023-02-28 VITALS — BP 108/69 | HR 65 | Temp 97.7°F

## 2023-02-28 DIAGNOSIS — R3915 Urgency of urination: Secondary | ICD-10-CM

## 2023-02-28 DIAGNOSIS — R109 Unspecified abdominal pain: Secondary | ICD-10-CM | POA: Diagnosis not present

## 2023-02-28 DIAGNOSIS — N281 Cyst of kidney, acquired: Secondary | ICD-10-CM | POA: Diagnosis not present

## 2023-02-28 DIAGNOSIS — R1012 Left upper quadrant pain: Secondary | ICD-10-CM

## 2023-02-28 DIAGNOSIS — N200 Calculus of kidney: Secondary | ICD-10-CM | POA: Insufficient documentation

## 2023-02-28 DIAGNOSIS — G8929 Other chronic pain: Secondary | ICD-10-CM

## 2023-02-28 DIAGNOSIS — R350 Frequency of micturition: Secondary | ICD-10-CM | POA: Insufficient documentation

## 2023-02-28 DIAGNOSIS — Z87442 Personal history of urinary calculi: Secondary | ICD-10-CM

## 2023-02-28 LAB — URINALYSIS, ROUTINE W REFLEX MICROSCOPIC
Bilirubin, UA: NEGATIVE
Glucose, UA: NEGATIVE
Ketones, UA: NEGATIVE
Leukocytes,UA: NEGATIVE
Nitrite, UA: NEGATIVE
Protein,UA: NEGATIVE
Specific Gravity, UA: 1.02 (ref 1.005–1.030)
Urobilinogen, Ur: 0.2 mg/dL (ref 0.2–1.0)
pH, UA: 6 (ref 5.0–7.5)

## 2023-02-28 LAB — MICROSCOPIC EXAMINATION: Bacteria, UA: NONE SEEN

## 2023-03-03 ENCOUNTER — Ambulatory Visit (HOSPITAL_COMMUNITY)
Admission: RE | Admit: 2023-03-03 | Discharge: 2023-03-03 | Disposition: A | Discharge status: Home / Self Care | Payer: Medicare HMO | Source: Ambulatory Visit | Attending: Urology | Admitting: Urology

## 2023-03-03 DIAGNOSIS — G8929 Other chronic pain: Secondary | ICD-10-CM | POA: Diagnosis present

## 2023-03-03 DIAGNOSIS — R1012 Left upper quadrant pain: Secondary | ICD-10-CM | POA: Insufficient documentation

## 2023-03-03 DIAGNOSIS — R109 Unspecified abdominal pain: Secondary | ICD-10-CM | POA: Insufficient documentation

## 2023-03-03 DIAGNOSIS — N281 Cyst of kidney, acquired: Secondary | ICD-10-CM | POA: Insufficient documentation

## 2023-03-03 MED ORDER — GADOBUTROL 1 MMOL/ML IV SOLN
5.7000 mL | Freq: Once | INTRAVENOUS | Status: AC | PRN
  Administered 2023-03-03: 5.7 mL via INTRAVENOUS

## 2023-03-08 ENCOUNTER — Telehealth: Payer: Self-pay

## 2023-03-08 NOTE — Telephone Encounter (Signed)
-----   Message from Rebecca Fernandez sent at 03/08/2023  8:38 AM EST ----- Please let pt know that her MRI showed no acute findings. The left renal cyst was characterized as "definitively benign" with no further follow-up or characterization is required.

## 2023-03-08 NOTE — Telephone Encounter (Signed)
Tried calling patient with no answer, left voiced message for return call to the office.

## 2023-03-08 NOTE — Telephone Encounter (Signed)
Patient return call. Patient was made aware and voiced understanding.

## 2023-03-13 ENCOUNTER — Other Ambulatory Visit (HOSPITAL_COMMUNITY): Payer: PPO

## 2023-08-30 NOTE — Progress Notes (Deleted)
 Name: Rebecca Fernandez DOB: 08/22/1955 MRN: [id number]  History of Present Illness: Rebecca Fernandez is a 68 y.o. female who presents today for follow up visit at [facility].  Relevant History includes: 1. OAB with urinary frequency and urgency. - Exacerbated by caffeine intake (coffee).  2 Kidney stone(s). - Reports surgery in the early 1980s for open stone removal from right distal ureter.  3. Renal cysts.  At initial visit on 02/28/2023: 1. Renal cysts + chronic LUQ / left flank pain: Discussed low risk for malignancy related to renal cysts. Based on small / decreasing size of the left renal cyst she was advised that it is unlikely to be the etiology for her chronic LUQ and left flank pain, however due to the chronicity of her symptoms and indeterminate evaluation of the left renal cyst on recent RUS we agreed to obtain MRI abdomen w/wo contrast for further evaluation.  2. Kidney stones. No stones per recent RUS in October 2024. 3. Urinary urgency and frequency. Advised to decrease caffeine intake.  Since last visit: > 03/08/2023: MRI showed no acute findings. The left renal cyst was characterized as "definitively benign" with no further follow-up or characterization required (Bosniak category II).  Today: She reports ***  She {Actions; denies-reports:120008} flank pain or abdominal pain. She {Actions; denies-reports:120008} fevers, nausea, or vomiting.  She {Actions; denies-reports:120008} increased urinary urgency, frequency, nocturia, dysuria, gross hematuria, hesitancy, straining to void, or sensations of incomplete emptying.   Medications: Current Outpatient Medications  Medication Sig Dispense Refill   acetaminophen  (TYLENOL ) 650 MG CR tablet Take 650 mg by mouth every 8 (eight) hours as needed for pain.     buPROPion  (WELLBUTRIN  XL) 300 MG 24 hr tablet Take 1 tablet by mouth daily.     clonazePAM (KLONOPIN) 1 MG tablet Take 1 mg by mouth 2 (two) times daily.      dicyclomine  (BENTYL ) 10 MG capsule Take 1 capsule (10 mg total) by mouth every 8 (eight) hours as needed for spasms. 30 capsule 0   estradiol (ESTRACE) 0.5 MG tablet Take 0.5 mg by mouth daily.      famotidine  (PEPCID ) 40 MG tablet TAKE (1) TABLET BY MOUTH ONCE DAILY. 30 tablet 0   methocarbamol  (ROBAXIN ) 500 MG tablet Take 1 tablet (500 mg total) by mouth every 6 (six) hours as needed for muscle spasms (muscle pain). 20 tablet 0   pantoprazole (PROTONIX) 40 MG tablet Take 1 tablet by mouth daily.     sertraline (ZOLOFT) 25 MG tablet Take 25 mg by mouth daily.     VITAMIN A PO Take 1 tablet by mouth 2 (two) times daily.     XIIDRA 5 % SOLN Place 1 drop into both eyes 2 (two) times daily.     No current facility-administered medications for this visit.   Facility-Administered Medications Ordered in Other Visits  Medication Dose Route Frequency Provider Last Rate Last Admin   technetium pyrophosphate Tc 66m injection 8.8 millicurie  8.8 millicurie Intravenous Once Darlis Eisenmenger, MD        Allergies: No Known Allergies  Past Medical History:  Diagnosis Date   Kidney stones    Past Surgical History:  Procedure Laterality Date   CATARACT EXTRACTION     FOOT SURGERY     KIDNEY STONE SURGERY     LASIK     PALATE / UVULA BIOPSY / EXCISION     Family History  Problem Relation Age of Onset   Heart disease  Other    Arthritis Other    Lung disease Other    Cancer Other    Social History   Socioeconomic History   Marital status: Married    Spouse name: Not on file   Number of children: Not on file   Years of education: Not on file   Highest education level: Not on file  Occupational History   Not on file  Tobacco Use   Smoking status: Never   Smokeless tobacco: Never  Substance and Sexual Activity   Alcohol use: No    Comment: occasionally   Drug use: No   Sexual activity: Not on file  Other Topics Concern   Not on file  Social History Narrative   Not on file    Social Drivers of Health   Financial Resource Strain: Patient Declined (07/28/2021)   [facility] visits prior to 05/28/2022., [facility] visits prior to 05/28/2022., [facility]   Overall Financial Resource Strain (CARDIA)    Difficulty of Paying Living Expenses: Patient declined  Food Insecurity: Low Risk  (08/08/2023)   [facility]   Hunger Vital Sign    Worried About Running Out of Food in the Last Year: Never true    Ran Out of Food in the Last Year: Never true  Transportation Needs: No Transportation Needs (08/08/2023)   Received from [facility]    In the past 12 months, has lack of reliable transportation kept you from medical appointments, meetings, work or from getting things needed for daily living? : No  Physical Activity: Inactive (07/28/2021)   [facility], [facility] visits prior to 05/28/2022., [facility] visits prior to 05/28/2022., [facility]   Exercise Vital Sign    Days of Exercise per Week: 0 days    Minutes of Exercise per Session: 0 min  Stress: Stress Concern Present (07/28/2021)   Received from [facility], [facility] visits prior to 05/28/2022., [facility] visits prior to 05/28/2022., [facility]   [employer] of Occupational Health - Occupational Stress Questionnaire    Feeling of Stress : To some extent  Social Connections: Unknown (07/28/2021)   [facility], [facility] visits prior to 05/28/2022., [facility] visits prior to 05/28/2022., [facility]   Social Connection and Isolation Panel [NHANES]    Frequency of Communication with Friends and Family: More than three times a week    Frequency of Social Gatherings with Friends and Family: Twice a week    Attends Religious Services: Patient  declined    Database administrator or Organizations: No    Attends Banker Meetings: Patient declined    Marital Status: Married  Catering manager Violence: Not At Risk (07/28/2021)   [facility] visits prior to 05/28/2022., [facility] visits prior to 05/28/2022.   Humiliation, Afraid, Rape, and Kick questionnaire    Fear of Current or Ex-Partner: No    Emotionally Abused: No    Physically Abused: No    Sexually Abused: No    Review of Systems Constitutional: Patient denies any unintentional weight loss or change in strength lntegumentary: Patient denies any rashes or pruritus Cardiovascular: Patient denies chest pain or syncope Respiratory: Patient denies shortness of breath Gastrointestinal: ***Patient denies nausea, vomiting, constipation,  or diarrhea ***As per HPI Musculoskeletal: Patient denies muscle cramps or weakness Neurologic: Patient denies convulsions or seizures Allergic/Immunologic: Patient denies recent allergic reaction(s) Hematologic/Lymphatic: Patient denies bleeding tendencies Endocrine: Patient denies heat/cold intolerance  GU: As per HPI.  OBJECTIVE There were no vitals filed for this visit. There is no height or weight on file to calculate BMI.  Physical Examination Constitutional: No obvious distress; patient is non-toxic appearing  Cardiovascular: No visible lower extremity edema.  Respiratory: The patient does not have audible wheezing/stridor; respirations do not appear labored  Gastrointestinal: Abdomen non-distended Musculoskeletal: Normal ROM of UEs  Skin: No obvious rashes/open sores  Neurologic: CN 2-12 grossly intact Psychiatric: Answered questions appropriately with normal affect  Hematologic/Lymphatic/Immunologic: No obvious bruises or sites of spontaneous bleeding  UA: ***negative ***positive for *** leukocytes, *** blood, ***nitrites Urine microscopy: *** WBC/hpf, ***  RBC/hpf, *** bacteria ***glucosuria (secondary to ***Jardiance ***Farxiga use) ***otherwise unremarkable  PVR: *** ml  ASSESSMENT No diagnosis found. ***  We agreed to plan for follow up in *** months / ***1 year or sooner if needed. Patient verbalized understanding of and agreement with current plan. All questions were answered.  PLAN Advised the following: 1. *** 2. ***No follow-ups on file.  No orders of the defined types were placed in this encounter.   It has been explained that the patient is to follow regularly with their PCP in addition to all other providers involved in their care and to follow instructions provided by these respective offices. Patient advised to contact urology clinic if any urologic-pertaining questions, concerns, new symptoms or problems arise in the interim period.  There are no Patient Instructions on file for this visit.  Electronically signed by:  Lauretta Ponto, FNP   08/30/23    9:33 AM

## 2023-08-31 ENCOUNTER — Ambulatory Visit: Payer: PPO | Admitting: Urology

## 2023-08-31 DIAGNOSIS — N281 Cyst of kidney, acquired: Secondary | ICD-10-CM

## 2023-08-31 DIAGNOSIS — R3915 Urgency of urination: Secondary | ICD-10-CM

## 2023-08-31 DIAGNOSIS — R350 Frequency of micturition: Secondary | ICD-10-CM

## 2023-08-31 DIAGNOSIS — N200 Calculus of kidney: Secondary | ICD-10-CM
